# Patient Record
Sex: Male | Born: 1935 | Race: White | Hispanic: No | Smoking: Former smoker
Health system: Southern US, Community
[De-identification: ages and names within clinical notes are randomized; demographics above are authoritative.]

## PROBLEM LIST (undated history)

## (undated) DIAGNOSIS — J439 Emphysema, unspecified: Secondary | ICD-10-CM

## (undated) DIAGNOSIS — I739 Peripheral vascular disease, unspecified: Secondary | ICD-10-CM

## (undated) HISTORY — PX: VASCULAR SURGERY: SHX849

## (undated) HISTORY — PX: CAROTID ENDARTERECTOMY: SUR193

---

## 1998-05-08 ENCOUNTER — Encounter: Payer: Self-pay | Admitting: *Deleted

## 1998-05-08 ENCOUNTER — Inpatient Hospital Stay (HOSPITAL_COMMUNITY): Admission: RE | Admit: 1998-05-08 | Discharge: 1998-05-13 | Payer: Self-pay | Admitting: *Deleted

## 1998-05-09 ENCOUNTER — Encounter: Payer: Self-pay | Admitting: *Deleted

## 2000-07-03 ENCOUNTER — Encounter: Payer: Self-pay | Admitting: *Deleted

## 2000-07-05 ENCOUNTER — Ambulatory Visit (HOSPITAL_COMMUNITY): Admission: RE | Admit: 2000-07-05 | Discharge: 2000-07-05 | Payer: Self-pay | Admitting: *Deleted

## 2002-02-19 ENCOUNTER — Encounter: Payer: Self-pay | Admitting: *Deleted

## 2002-02-21 ENCOUNTER — Ambulatory Visit (HOSPITAL_COMMUNITY): Admission: RE | Admit: 2002-02-21 | Discharge: 2002-02-21 | Payer: Self-pay | Admitting: *Deleted

## 2002-03-08 ENCOUNTER — Encounter (HOSPITAL_COMMUNITY): Admission: RE | Admit: 2002-03-08 | Discharge: 2002-04-07 | Payer: Self-pay | Admitting: Cardiology

## 2002-05-10 ENCOUNTER — Encounter: Payer: Self-pay | Admitting: *Deleted

## 2002-05-14 ENCOUNTER — Inpatient Hospital Stay (HOSPITAL_COMMUNITY): Admission: RE | Admit: 2002-05-14 | Discharge: 2002-05-20 | Payer: Self-pay | Admitting: *Deleted

## 2002-05-14 ENCOUNTER — Encounter: Payer: Self-pay | Admitting: *Deleted

## 2002-05-14 ENCOUNTER — Encounter (INDEPENDENT_AMBULATORY_CARE_PROVIDER_SITE_OTHER): Payer: Self-pay | Admitting: *Deleted

## 2002-05-15 ENCOUNTER — Encounter: Payer: Self-pay | Admitting: *Deleted

## 2002-05-16 ENCOUNTER — Encounter: Payer: Self-pay | Admitting: *Deleted

## 2004-12-24 ENCOUNTER — Ambulatory Visit: Payer: Self-pay | Admitting: Internal Medicine

## 2007-10-23 ENCOUNTER — Inpatient Hospital Stay (HOSPITAL_COMMUNITY): Admission: EM | Admit: 2007-10-23 | Discharge: 2007-10-28 | Payer: Self-pay | Admitting: Emergency Medicine

## 2007-10-23 ENCOUNTER — Encounter: Admission: RE | Admit: 2007-10-23 | Discharge: 2007-10-23 | Payer: Self-pay | Admitting: Family Medicine

## 2007-10-24 ENCOUNTER — Encounter (INDEPENDENT_AMBULATORY_CARE_PROVIDER_SITE_OTHER): Payer: Self-pay | Admitting: General Surgery

## 2010-05-23 ENCOUNTER — Encounter: Payer: Self-pay | Admitting: Internal Medicine

## 2010-09-14 NOTE — Op Note (Signed)
NAMETEJ, MURDAUGH              ACCOUNT NO.:  [id number]   MEDICAL RECORD NO.:  [id number]          PATIENT TYPE:  INP   LOCATION:  5501                         FACILITY:  [facility]   PHYSICIAN:  Gabrielle Dare. Janee Morn, M.D.DATE OF BIRTH:  [birth year]   DATE OF PROCEDURE:  10/24/2007  DATE OF DISCHARGE:                               OPERATIVE REPORT   PREOPERATIVE DIAGNOSIS:  Acute cholecystitis.   POSTOPERATIVE DIAGNOSIS:  Acute cholecystitis.   PROCEDURE:  Laparoscopic cholecystectomy.   SURGEON:  Liz Malady, MD.   ASSISTANT:  Adolph Pollack, MD.  Letha Cape, PA-C.   ANESTHESIA:  General.   HISTORY OF PRESENT ILLNESS:  Mr. Acton is an 77 year old male who was  admitted yesterday with a 2-day history of right upper quadrant pain.  CT scan done as an outpatient demonstrated acute cholecystitis and  possible pancreatitis.  He was admitted to the Medical Service.  He has  multiple medical problems.  Further evaluation with laboratory studies  demonstrated amylase and lipase within normal limits.  He was placed on  IV antibiotics and volume resuscitated.  He is brought down today for  urgent cholecystectomy.   PROCEDURE IN DETAIL:  Informed consent was obtained.  The patient was  identified in the preop holding area.  He was receiving intravenous  antibiotics.  He was brought to the operating room, and general  anesthesia was administered by the anesthesia staff.  His abdomen was  prepped and draped in the sterile fashion.  The supraumbilical area was  infiltrated with 0.25% Marcaine with epinephrine.  A supraumbilical  incision was made along the midline.  This was medial to his previous  scar which was somewhat off the midline.  Subcutaneous tissues were  dissected down revealing the anterior fascia.  This was divided sharply  and the peritoneal cavity was entered under direct vision without  difficulty, some omental adhesions were generally swept away.  A 0  Vicryl  purse-string suture was placed around the fascial opening and a  Hasson trocar was inserted into the abdomen.  The abdomen was  insufflated with carbon dioxide in standard fashion under direct vision,  and 11-mm epigastric and two 5-mm lateral ports were placed.  A 0.25%  Marcaine with epinephrine was used in all port sites.  The gallbladder  was wrapped and omentum stuck up to the anterior abdominal wall.  This  was gently swept down.  The omentum was then gradually and gently swept  off revealing an extremely inflamed gallbladder and this was drained  with Nejot device and there was purulent bile.  Much was drained to  facilitate the retraction of the body cephalad.  Further omental  adhesion were gradually swept down revealing the infundibulum.  This was  retracted inferolaterally.  The dissection began laterally and gradually  progressed medially.  Initially, some tissue over the cystic duct were  circumferentially dissected and clearly visualized this likely contained  a anterior branch of cystic artery.  This was clipped twice proximally  once distally and divided.  Further dissection also showed a posterior  branch of cystic artery, this  was clipped twice proximally, once  distally and divided.  Dissection then continued identifying the cystic  duct which was somewhat short.  There was significant omentum  inflammation there precluding to see performance of intraoperative  angiogram.  Further dissection was done.  The gallbladder was very  friable and falling apart.  The infundibulum was probed to confirm  identification of the cystic duct at the tapering of infundibulum.  Cystic duct there at that point was then dissected further and clipped  twice proximally and divided distally.  I would not hold another clip.  There was no leakage of bile from it.  The gallbladder was then taken  off the liver bed using Bovie cautery to get excellent hemostasis.  It  was placed in EndoCatch bag  and removed from the abdomen via the  supraumbilical port site.  The abdomen was copiously irrigated.  The  liver bed was again cauterized in several areas to get good hemostasis.  Once this was achieved two 1-1/2 pieces of Surgicel were placed in the  liver bed and due to the severe inflammation a 19-French round Blake  drain inserted via the lateral of the two port sites.  This was placed  in the gallbladder fossa.  The remainder of the irrigation fluid was  evacuated and it was clear.  Liver bed remains dry and clips remained in  good position.  Other ports were removed, pneumoperitoneum was released,  the supraumbilical Hasson was removed and the fascia there was tied and  closed with tiny 0 Vicryl purse-string suture with care not to trap any  intra-abdominal contents.  Remaining three wounds are copiously  irrigated.  Skin was closed with running 4-0 Vicryl and forming a  subcuticular stitch and the wound were dressed with Dermabond.  The  patient tolerated the procedure well without apparent complications, was  taken to the recovery room in stable condition.      Gabrielle Dare Janee Morn, M.D.  Electronically Signed     BET/MEDQ  D:  10/24/2007  T:  10/25/2007  Job:  [id number]   cc:   Lonia Blood, M.D.  Adolph Pollack, M.D.  Letha Cape, PA

## 2010-09-14 NOTE — H&P (Signed)
[name], [name] NO.:  [id number]   MEDICAL RECORD NO.:  [id number]          PATIENT TYPE:  INP   LOCATION:  5501                         FACILITY:  [facility]   PHYSICIAN:  Lonia Blood, M.D.       DATE OF BIRTH:  [birth year]   DATE OF ADMISSION:  10/23/2007  DATE OF DISCHARGE:                              HISTORY & PHYSICAL   PRIMARY CARE PHYSICIAN:  Dr. Ace Gins, MD with [facility].   CHIEF COMPLAINT:  Abdominal pain.   HISTORY OF PRESENT ILLNESS:  Mario Hartman is an 77 year old gentleman with  extensive atherosclerotic disease, COPD, and diabetes, who presented to  his primary care physician with couple of days of severe abdominal pain,  nausea, vomiting, and diarrhea.  He had the CT scan done at the  outpatient radiology center which showed acute cholecystitis and the  patient was referred to the emergency room.  In the emergency room, the  patient was febrile, hypotensive, and hypoxic and we were called to  admit him.  Currently, the patient is stabilized and feeling a little  bit better.  He does not have any further abdominal pain.  He denies any  chest pain and he says he is not short of breath right now.  He already  has been evaluated by the general surgeon.  He is scheduled for  operating room tomorrow.   PAST MEDICAL HISTORY:  1. COPD.  2. Peripheral vascular disease, status post aorto-bi-iliac bypass in      2004.  3. Status post right carotid endarterectomy in 2000.  4. Diabetes mellitus type 2.  5. Hypertension.  6. Hyperlipidemia.   HOME MEDICATIONS:  Lipitor, Altace, Celebrex, Flexeril, glipizide, and  Mirapex.   ALLERGIES:  No known drug allergies.   SOCIAL HISTORY:  The patient is a former tobacco user, quit in 1991.  Denies drinking alcohol.  Denies using drugs.  He is a widower and lives  with his son-in-law.   FAMILY HISTORY:  Positive for coronary artery disease.   REVIEW OF SYSTEMS:  As per HPI.  Positive  also for some restless leg  syndrome.  All other systems reviewed and negative.   PHYSICAL EXAMINATION:  VITAL SIGNS:  Upon admission, temperature 101.4,  heart rate 112, respiratory rate 18, and saturation 88% on room air.  GENERAL APPEARANCE:  This is a chronically ill gentleman, in no acute  distress, lying on stretcher.  He seems alert and oriented to place,  person, and time.  HEAD:  Normocephalic and atraumatic.  EYES:  Pupils are equal, round, and reactive light and accommodation.  Extraocular movements are intact.  THROAT:  Clear.  NECK:  Supple.  No JVD.  CHEST:  Bilateral rhonchi.  No wheezes.  No crackles.  Definitely barrel-  shaped.  HEART:  Tachycardiac, regular without murmurs, rubs, or gallops.  Please  note, the heart sounds are quite distant due to emphysema.  ABDOMEN:  Distended, tense, tympanitic, bowel sounds decreased but  present.  There is some minimal diffuse tenderness.  Right upper  quadrant tenderness is present.  Mario Hartman sign  is positive.  LOWER EXTREMITIES:  Without edema.  Feet are examined bilaterally and  they are without any trophic changes.  Pulse is decreased but present in  the lower extremities.   LABORATORY DATA:  On admission, white blood cell count is 26,000,  hemoglobin 13, and platelet count 276.  Sodium 131, potassium 4.2,  chloride is 98, bicarbonate 24, BUN 42, creatinine 2.7, glucose 64,  albumin 3.2, AST 36, and total bilirubin is 1.4.  EKG shows normal sinus  rhythm, question of old septal MI.  No acute ST-T changes.  CT scan of  the abdomen indicates common bile duct stones, gallbladder stones,  changes of acute cholecystitis, and question of changes of acute  pancreatitis.   ASSESSMENT/PLAN:  1. Sepsis, most likely biliary source with acute cholecystitis,      probably component of acute cholangitis as well.  Mr. Benning will      be admitted, placed on intravenous fluids.  Empiric Unasyn has been      started by the surgeon.  The  patient will be stabilized and      monitored overnight. Tomorrow he is scheduled to go for      cholecystectomy.  2. Acute renal failure, most likely secondary to medications and      sepsis.  The patient's ACE inhibitor and COX-2 medication will be      discontinued.  He will be hydrated and his renal function will be      closely monitored.  3. Chronic obstructive pulmonary disease.  Mr. Amero is not oxygen-      dependent.  He does seem though clinically to have quite      significant degree of emphysema.  He will be placed on nebulizers,      incentive spirometry and his status will be closely monitor postop.  4. Extensive atherosclerotic disease.  Mr. Richrd Sox is at high risk of      perioperative complications.  He will be dosed with a beta-      blockers, aspirin and his statin will be continued.  5. Diabetes mellitus with hypoglycemia.  This is most likely secondary      to the sulfonylurea in the setting of acute renal insufficiency.      Intravenous D5 will be given through the night and the patient's      CBGs will be monitored closely.  Postop, he will need CBG checks      every 4 hours. Will start a sliding scale insulin once hypoglycemia      resolves.  6. Deep vein thrombosis prophylaxis will be done with PAS hoses until      the patient can take heparin.      Lonia Blood, M.D.  Electronically Signed     SL/MEDQ  D:  10/23/2007  T:  10/25/2007  Job:  [id number]   cc:   Ace Gins, MD

## 2010-09-14 NOTE — Discharge Summary (Signed)
NAMECHICK, COUSINS NO.:  [id number]   MEDICAL RECORD NO.:  [id number]          PATIENT TYPE:  INP   LOCATION:  5504                         FACILITY:  [facility]   PHYSICIAN:  Beckey Rutter, MD  DATE OF BIRTH:  1923-11-03   DATE OF ADMISSION:  10/23/2007  DATE OF DISCHARGE:  10/28/2007                               DISCHARGE SUMMARY   PRIMARY CARE PHYSICIAN:  Ace Gins, MD, [facility].   CHIEF COMPLAINT:  Abdominal pain.   HISTORY OF PRESENT ILLNESS:  An 28 pleasant Caucasian male admitted for  abdominal pain and found to have cholecystitis.   HOSPITAL COURSE:  1. Acute cholecystitis.  The patient is status post laparoscopic      cholecystectomy.  He improved with Primaxin.  During the hospital      stay with no fever and continuous decreasing of the white blood      count.  The patient is stable to be released as per surgical      service.  2. COPD.  The patient seems to have exacerbation post surgery.  He was      not given steroid because of the state of infection and      questionable sepsis that he presented with.  The patient required      antibiotic for the COPD exacerbation and currently the patient is      on 2 liters of oxygen.  His saturation is 93% on the room air and      we will conduct exercise test to evaluate if the patient needs to      be released with oxygen at this time.  The patient had COPD without      oxygen dependency prior to admission.  3. Ethanol withdrawal.  The patient become a little bit shaky during      hospital course.  He drinks 2 to 3 drinks of whiskey on a daily      basis.  The patient was kept on Librium protocol with thiamine and      folic acid.  The patient has no intention to quit those couple of      whiskey glasses after discharge and for that we will not continue      on protocol after discharge.  Although he recommended to still      decrease his alcohol consumption further.   DISCHARGE DIAGNOSES:  1. Acute cholecystitis.  Now he is status post laparoscopic      cholecystectomy done by Dr. Ermalene Searing.  2. Chronic obstructive pulmonary disease with exacerbation.  3. Diabetes.  4. Hypertension.  5. Alcohol withdrawal, required Librium protocol.   DISCHARGE MEDICATIONS:  1. Augmentin 875 mg p.o. b.i.d. for 7 more days.  2. Atrovent q.6 h.  3. Multivitamins daily.  4. Folic acid 1 mg daily.  5. Thiamine 100 mg p.o. daily.  6. Colace 100 mg p.o. nightly.  7. Tylenol p.r.n.  8. Glipizide.   DISCHARGE PLAN:  The patient is discharged today to follow up with Dr.  Ace Gins within a week.  The patient is  being fever-free and is  stable for discharge during this hospital course.  The patient had 78%  oxygen saturation after walk in the hall here to evaluate the need for  home oxygen.  With 78% clearly he needs home oxygen for COPD  exacerbation, although the patient need to be reevaluated for the need  of home oxygen and that is why he was recommended to follow up with Dr.  Ace Gins within this week to further assess his white blood count  and reevaluate his COPD exacerbation and the need for oxygen.  The  patient is aware and agreeable to discharge plan.      Beckey Rutter, MD  Electronically Signed     EME/MEDQ  D:  10/28/2007  T:  10/28/2007  Job:  [id number]

## 2010-09-14 NOTE — Consult Note (Signed)
NAMEWILL, HEINKEL NO.:  [id number]   MEDICAL RECORD NO.:  [id number]          PATIENT TYPE:  EMS   LOCATION:  MAJO                         FACILITY:  [facility]   PHYSICIAN:  Ardeth Sportsman, MD     DATE OF BIRTH:  [birth year]   DATE OF CONSULTATION:  10/23/2007  DATE OF DISCHARGE:                                 CONSULTATION   PRIMARY CARE PHYSICIAN:  Ace Gins, MD.   REQUESTING PHYSICIAN:  Devoria Albe, MD   REASON FOR CONSULTATION:  Abdominal pain, probable cholecystitis.   HISTORY OF PRESENT ILLNESS:  Mr. Zane is an 77 year old male with a  history of peripheral vascular disease, status post carotid  endarterectomy; with right renal artery stenosis; aortoiliac disease,  hypertension; possible COPD; and diabetes.  He notes that 2 days ago he  started to having upper abdominal pain that was rather intense.  It seem  to be greater on his right than his left.  He had some nausea but no  definite emesis.  He feels that the pain medicine did not seem to help  things out.  Given the concern of CT scan for a cholecystitis and  possible common bile duct stones, it was recommended that he go to the  emergency room.  He had been struggling with severe pain.  He notes 2  months ago he had a similar episode of pain after eating, but it went  away in less than 24 hours, and he did not require any further treatment  for that.  He denies any history of heartburn or reflux.  He has never  had a colonoscopy.  Normally he has bowel movement everyday with no bad  bouts of constipation or diarrhea.  No history of inflammatory bowel  disease, Crohn disease, or irritable bowel syndrome that he can recall.  No sick contacts or travel history.  No history of pancreatitis.  He  does drink liquor on a daily basis, but denies any major problems in the  past.   PAST MEDICAL HISTORY:  1. Aortoiliac occlusive disease.  2. Severe right renal artery stenosis.  3. Ischemic wrist pain  on the right foot, status post aortobifemoral      bypass.  4. Bilateral profundoplasty.  5. Right aortorenal bypass.  6. Diabetes mellitus non-insulin requiring.  7. Hypertension.  8. Probable chronic obstructive pulmonary disease with history of long-      term tobacco abuse.  9. Alcohol dependence.  10.Chronic degenerative joint disease.   PAST SURGICAL HISTORY:  1. He cannot recall any significant abdominal surgery aside from his      aortobifemoral bypass with bilateral profundoplasty and right      aortorenal bypass on January 2004.  2. Right carotid endarterectomy in 2003.   ALLERGIES:  Denies.   MEDICATIONS:  1. Altace.  2. Celebrex.  3. Flexeril.  4. Glipizide.  5. Lipitor.  6. Mirapex.   DIAGNOSIS:  Hypocholesterolemia.   SOCIAL HISTORY:  He tells me he drinks liquor couple of times at night.  He had 100-pack-year history of tobacco because he smoked 2 packs a  day  for 64 years and quit in 2000.  No drug use.  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  He  lives in [place] with his former son-in-law.   FAMILY HISTORY:  He cannot recall any significant history.  His mother  passed away from breast cancer at the age of 28s.  His father had a  myocardial infarction, but no siblings.   REVIEW OF SYSTEMS:  As noted per HPI.  GENERAL:  He has never had any  fever or chills, although he feels tremulous since he had a low blood  sugar in the ER.  No change in weight.  EYES:  He does wear glasses  otherwise negative.  ENT:  Negative.  CARDIAC:  As noted above.  No  exertional chest pain or shortness of breath.  He did have a cardiac  catheterization evaluation including a nuclear adenosine Cardiolite  study, which showed an EF around 56%, but this was back in 2003.  I do  not know if he has had anything done since that time.  GI:  As noted per  HPI.  No hematochezia or melena.  GU:  Negative.  MUSCULOSKELETAL:  He   has chronic back pain.  He also complaining of some chronic hip pain as  well, but it has not changed markedly recently.  Otherwise, negative.  PSYCH:  Allergic.  GU:  Testicles and breasts negative. Heme:  Negative.  Lymphs:  Negative.   PHYSICAL EXAMINATION:  VITAL SIGNS:  His temperature is 98.0.  His pulse  is 97-110 seems sinus and respiration is 20.  His pain initially was  0/10, but on my exam it is more like 7/10.  He was 0/10 just after  receiving some pain medications.  He is 94% on room air.  GENERAL:  He is a well developed, well nourished obese male shaking in  bed but consolable.  PSYCHIATRY:  He is awake and oriented x4.  He is obviously uncomfortable  but consolable.  He is frankly toxic at this time.  No end-stage  dementia, delirium, psychosis, or paranoia.  NEUROLOGIC:  Cranial nerves II through XII are intact.  Handgrips 5/5,  equal, and symmetrical.  He does have a resting sort of body shake but  no true intention tremors.  No focal or sensory motor weakness.  EYES:  He does wear glasses.  The pupils are equal, round, and reactive  to light.  Extraocular movements are intact.  His sclerae is nonicteric  or injected.  NECK:  Supple without any masses.  He has a right oblique neck scar  consistent with his prior endarterectomy but no obvious masses.  HEENT:  He is normocephalic.  No facial asymmetry.  Mucous membranes are  dry but nasopharynx and oropharynx is clear.  HEART:  Regular rate and rhythm.  No definite murmurs, gallops, or rubs.  CHEST:  Clear to auscultation bilaterally.  No wheezes.  No rhonchi.  No  pain on ribs on compression.  ABDOMEN:  Obese but soft.  It seems slightly distended.  Left side of  his abdomen is completely nontender.  Right lower abdomen is mildly  tender, but his right upper quadrant is very tender with a Murphy sign.  He has midline incision with no obvious incisional hernias.  GENITOURINARY:  Normal external genitalia.  No obvious  inguinal hernias.  I did not do rectal per his request.  EXTREMITIES:  No  significant edema or cyanosis.  LYMPH NODE:  No head, neck, axillary, or subcarinal lymphadenopathy.  SKIN:  No major petechia and purpura.  No major sores or lesions.   LABORATORY DATA:  His white count is 26.5 with a hemoglobin of 13.2 with  a left shift.  His potassium 4.2; his BUN is 42; his creatinine is 2.7;  albumin of 3.2; his total bilirubin is 1.6; his alk phos is 36, which is  actually in a low; and AST and ALT are in the normal range.  His lipase  is 22, which is normal.  He had a CT scan, which shows obvious  gallbladder wall thickening.  Pericholecystic fluids suspicious for  cholecystitis.  He may have some common bile duct stones but his bile  duct is only at 9 mm.  No definite evidence of any pancreatitis.  His  abdomen is obese but no strong evidence of bowel obstruction or massive  distention.  He is status post aortobifemoral bypass.  He has  calcifications within the kidneys itself.  He might have some  pancreatitis, but it difficult to say.  Mild fatty infiltration of the  liver.  Appendix is normal.   ASSESSMENT/PLAN:  An 77 year old male with significant tobacco abuse and  pulmonary issues with significant coronary disease with hypoglycemia and  diabetes with a prior renal stenosis without evidence of elevated  creatinine.  Prior creatinine in 2004 was 0.9 when he had no renal  artery stenosis at that time.  1. I think he needs medicine evaluation and admitted and following      given his numerous complex medical issues to help stabilize these      issues.  2. Aggressive intravenous fluid resuscitation initially.  3. Intravenous Unasyn q.6 h.  4. He will need treatment for his cholecystitis.  If he has clinical      deterioration or shock or does not improve in his other systems      then I will recommend percutaneous drainage of his gallbladder in      the morning.  If he does  stabilized and improve in his renal      function and other issues then I would set up for a cholecystectomy      in the morning.  The anatomy and physiology of hepatobiliary and      pancreatic function was discussed, past physiology of cholecystitis      with its risks were discussed.  Options discussed.  Recommendation      was made for laparoscopic cholecystectomy with intraoperative      cholangiogram.  Risks, benefits, and alternatives discussed.      Questions answered.  He and his family agreed to see.  5. Hold his aspirin for now.  6. SVDs for deep venous thrombosis prophylaxis.  7. Watch for alcohol withdrawal possible.  8. Diabetic control per medicine.  9. We will get an EKG 12 lead and a chest x-ray to just get some      baseline cardiac function.  I do not know if needs an      echocardiogram at this point if he is otherwise has descent      exercise tolerance, I will defer to medicine, and we will get an      insight from them.  I discussed with the patient, his daughter and      son-in-law, and they agreed to this plan.      Ardeth Sportsman, MD  Electronically Signed  SCG/MEDQ  D:  10/23/2007  T:  10/24/2007  Job:  [id number]   cc:   Ace Gins, MD  Devoria Albe, M.D.

## 2010-09-17 NOTE — Op Note (Signed)
NAME:  Mario Hartman, Mario Hartman                        ACCOUNT NO.:  [id number]   MEDICAL RECORD NO.:  [id number]                   PATIENT TYPE:  INP   LOCATION:  2316                                 FACILITY:  [facility]   PHYSICIAN:  Balinda Quails, M.D.                 DATE OF BIRTH:  08/28/1923   DATE OF PROCEDURE:  05/14/2002  DATE OF DISCHARGE:                                 OPERATIVE REPORT   SURGEON:  Balinda Quails, M.D.   ASSISTANT:  Eber Hong, P.A.   ANESTHETIC:  General endotracheal.   ANESTHESIOLOGIST:  Dr. Katrinka Blazing.   PREOPERATIVE DIAGNOSES:  1. Aortoiliac occlusive disease.  2. Ischemic rest pain, right foot.  3. Severe right renal artery stenosis.   POSTOPERATIVE DIAGNOSES:  1. Aortoiliac occlusive disease.  2. Ischemic rest pain, right foot.  3. Severe right renal artery stenosis.   PROCEDURES:  1. Aortobifemoral bypass.  2. Bilateral profundoplasty.  3. Right aortorenal bypass with 6-mm Dacron Hemashield graft.   CLINICAL NOTE:  The patient is a 77 year old male with a history of known  aortoiliac occlusive disease and severe claudication.  He has recently begun  to develop rest pain in his right foot.  Arteriography reveals advanced  aortoiliac disease.  Right external iliac occlusion.  Several stenoses in  the left iliac system.  Severe stenosis of the right renal artery origin.  Bilateral superficial femoral artery occlusions.   The patient is brought to the operating room at this time for lower  extremity revascularization with aortobifemoral bypass for limb salvage.  Also scheduled for right aortorenal bypass.   The risks of this operative procedure, including major complications, such  as MI, CVA, renal failure, limb loss, bleeding, infection, and death have  been discussed with the patient.  Patient morbidity and mortality risk of  approximately 3% to 5%.   OPERATIVE PROCEDURE:  The patient brought to the operating room in stable  condition.   Foley catheter, arterial line, Swan-Ganz catheter in place.  General endotracheal anesthesia induced.  In the supine position, the  abdomen and both legs prepped and draped in a sterile fashion.   A longitudinal skin incision made from the xiphoid to the umbilicus.  Dissection carried down through the subcutaneous tissue with electrocautery.  Linea alba incised at midline.  The peritoneal cavity entered without  difficulty.  Full laparotomy evaluation carried out.  Liver, gallbladder,  bile duct, pancreas were normal.  Stomach and duodenum were unremarkable.  Large bowel revealed no masses.  Small bowel was normal.   The small bowel retracted to the right, transverse colon brought out  superiorly.  The retroperitoneum incised along the infrarenal aorta.  The  left renal vein identified, mobilized, and tracked superiorly.  The anterior  mesenteric vein also retracted.  The juxta renal aorta was cleared.  The  right renal artery origin was identified, and the right renal artery was  dissected distally, deep to the inferior vena cava.  The proximal portion of  the right renal artery revealed ostial plaque, and the distal right renal  artery was free of significant plaque.   The infrarenal aorta was cleared down to the aortic bifurcation.  This was  calcified with plaque.  The anterior mesenteric artery origin was encircled  with the vessel loop.  Common iliac arteries bilaterally were severely  diseased with plaque.   Bilateral longitudinal groin skin incisions were made.  Subcutaneous tissue  and lymphatics divided with electrocautery.  The common femoral artery  dissected out.  The inguinal ligament encircled with a vessel loop.  The  common femoral artery dissected distally down to the origin of the  superficial femoral artery, which was also encircled with a vessel loop.  Bilateral superificial and femoral artery occlusions were present.  The  profunda femoris artery was dissected out  bilaterally.  The profunda veins  ligated with 2-0 and 3-0 silk, and divided.  The proximal profunda femoris  freed and encircled with a vessel loop.   Retroperitoneal tunnels were then created from the aortic bifurcation at  each groin.  The patient then administered 7000 units of heparin  intravenously, 25 g of ___________ intravenously.  Adequate circulation time  permitted.   The infrarenal aorta controlled with an aortic DeBakey clamp.  The aorta  also controlled distally at the bifurcation of the second aortic DeBakey  clamp.  The infrarenal aorta divided with an #11 blade.  There was severe  atherosclerotic plaque and degenerative plaque present.  A short segment of  aorta was excised, and the aorta bevelled distally.  The distal stump of  aorta was oversewn with running 3-0 Vicryl suture in 2 layers.  The distal  clamp then removed.  The inferior mesenteric artery was preserved in the  distal stump.   The proximal aortic cuff was then endarterectomized.  A 14 x 7 Hemashield  graft was then anastomosed end-to-end to the infrarenal aorta with a running  4-0 Prolene suture.  At the completion of the proximal anastomosis, the  aortic graft was flushed, and each limb of the graft controlled with a  Fogarty clamp.  The graft limbs were then tunneled to the respective groins,  through the retroperitoneal tunnels, behind the ureters.   In the right groin, the profunda femoris artery was controlled proximally  and distally, and a longitudinal arteriotomy made in the proximal profunda  femoris artery.  The right limb of the graft bevelled, and anastomosed end-  to-side to the profunda femoris using running 6-0 Prolene suture.  At  completion of this, adequate flushing carried out.  Clamps removed and the  right leg reperfused.   In the left groin, the common femoral artery, superficial, and profunda were all controlled with clamps.  A longitudinal arteriotomy was made in the  proximal  profunda femoris artery.  This was then extended proximally into  the common femoral artery.  The left limb of the graft was anastomosed end-  to-side from the left common femoral artery, bringing the toe of the graft  out onto the profunda femoris artery, using running 6-0 Prolene suture.  At  the completion of the left femoral anastomosis, the vessels were flushed,  clamps removed, and the left leg reperfused.   Attention then placed on the right renal artery.  A partial occlusion clamp  was placed across the aortic graft.  The aortic graft opened with an #11  blade and #5  punch.  A 6-mm Dacron graft was anastomosed end-to-side to the  aortic graft with running 5-0 Prolene suture.  At the completion of this,  the partial occlusion clamp removed, and the renal graft controlled with a  Fogarty clamp.  The right renal artery was then ligated proximally with an  #0 silk tie, and divided distally beyond the plaque.  The 6-mm Dacron renal  graft was then anastomosed end-to-end to the right renal artery with running  6-0 Prolene suture.  At the completion of the renal anastomosis, adequate  flushing carried out.  The clamps were removed.  The right kidney  reperfused.  Ischemia time of approximately 20 minutes.   The patient administered 50 mg of protamine intravenously.  Adequate  hemostasis obtained.  Sponge and instrument counts were correct.   The retroperitoneal was reapproximated over the graft with running 3-0  Vicryl suture.  The abdomen examined to assure that there were no retained  instruments or sponges.  The midline fascia then closed with a running #1  PDS suture, staples applied to skin.   The groin incision was irrigated with antibiotic solution.  Each groin  incision then closed with 2 layers of running 2-0 Vicryl suture in the  subcutaneous tissues.  Staples applied to the skin.  Sterile dressings  applied.   The patient tolerated the operative procedure well.  No  apparent  complications.  Transferred to the surgical intensive care unit in stable  condition.                                               Balinda Quails, M.D.    PGH/MEDQ  D:  05/14/2002  T:  05/14/2002  Job:  [id number]

## 2010-09-17 NOTE — Procedures (Signed)
[facility]  Patient:    Mario Hartman, Mario Hartman                     MRN: [id number] Proc. Date: 07/05/00 Adm. Date:  [id number] Attending:  Melvenia Needles CC:         Adam Phenix, M.D., [place]  Peripheral Catheterization Lab   Procedure Report  DIAGNOSIS:  Bilateral lower extremity claudication.  PROCEDURES: 1. Midabdominal aortogram with bilateral lower extremity runoff arteriography. 2. Left femoral intraarterial nitroglycerin injection. 3. Pullback pressure gradient, left common iliac artery.  ACCESS:  Left common femoral 5 French sheath.  CONTRAST:  Visipaque 165 mL. (severe generalized debilitation).  COMPLICATIONS:  None apparent.  CLINICAL NOTE:  This is a 77 year old male who presented to the office with marked disabling bilateral lower extremity claudication.  Ankle-brachial index 0.3 in the right leg, 0.5 in the left leg.  Evaluation revealed evidence of aortoiliac disease and multilevel infrainguinal disease.  The patient is brought to the catheterization lab at this time for diagnostic arteriography and possible intervention.  DESCRIPTION OF PROCEDURE:  The patient was brought to the catheterization lab in stable condition. Informed consent was obtained.  He received 5 mg of Valium preprocedure.  He was placed in the supine position.  Both groins were prepped and draped in the usual sterile fashion.  The skin and subcutaneous tissue in the left groin was instilled with 1% Xylocaine.  A needle was easily introduced to the left common femoral artery, and a 0.35 J wire passed through the needle into the midabdominal aorta. An initial 5 French sheath was advanced over the guidewire.  The sheath was flushed with heparin saline solution.  A pigtail catheter was then advanced over the guidewire into the Juxta renal aorta.  Standard AP midabdominal aortogram obtained.  This revealed single bilateral renal arteries which were  widely patent.  The infrarenal aorta revealed mild atherosclerotic irregularity without dominant stenosis.  The left common iliac artery revealed moderate atherosclerotic irregularity. There was approximately a 40% stenosis in the midportion of the left common iliac artery.  The left external iliac artery revealed diffuse disease without dominant stenosis.  The left internal iliac artery was small and there was stenosis at its origin.  The right iliac system revealed patency of the common iliac artery.  The right external iliac artery was occluded at its origin.  The large right internal iliac artery was present and provided extensive pelvic collaterals which reconstituted the right common femoral artery.  The pigtail catheter was brought down to the aortic bifurcation and lower extremity runoff arteriography obtained.  The left leg revealed continuous flow from the external iliac and the common femoral artery which revealed moderate irregularity.  There was a large left profunda femoris artery.  The left superficial femoral artery was occluded at its origin.  Profunda femoris collaterals then reconstituted disease above the popliteal segment with flow into the left anterior tibial artery which provided dominant flow to the left foot.  The left tibial peroneal trunk was occluded at its origin.  The left anterior tibial artery did reveal a stenotic segment in the mid calf.  The dorsalis pedis artery provided flow to the left foot.  Right lower extremity runoff arteriography revealed reconstitution of the right common femoral artery from pelvic collaterals.  The right superficial artery occluded at its origin.  The right profunda femoris artery was large and provided extensive pelvic collaterals.  Reconstitution was severely diseased above the  popliteal segment was evident in the right leg.  The right below-knee popliteal artery was more normal in caliber.  Runoff in the right lower  extremity was via patent anterior tibial and tibial peroneal arteries. The right posterior tibial artery was small and diseased in the midcalf. Dominant runoff distally was anterior tibial and peroneal in the right lower extremity. The pigtail catheter was then exchange over the J wire for an end-hole catheter.  Intraarterial injection of 200 mcg of nitroglycerin was then made in the left femoral sheath.  Pullback pressure gradient was made across the left iliac system from the aortic bifurcation to the external iliac artery. This revealed a 10 mm gradient.  The J wire was reinserted and the end-hole catheter removed.  The left femoral sheath removed and a long 7 French sheath advanced over the guidewire.  The patient was administered 3000 units of heparin intravenously.  Retrograde injection was then made through the left femoral sheath and this further delineated the left common iliac and external iliac anatomy.  This revealed a moderate stenosis at the mid left common iliac artery and extensive irregular disease at the external iliac artery without dominant stenosis.  It was decided at this time, not to perform an intervention.  The guidewire was removed.  The patient was transferred to the holding area.  The left femoral sheath was removed without incident.  The patient tolerated the procedure well without apparent complications.  FINAL IMPRESSION: 1. Aortoiliac occlusive disease with right external iliac occlusion.  Moderate    left common iliac and external iliac occlusive disease. 2. Bilateral superficial femoral artery occlusion. 3. Moderate bilateral tibial vessel runoff occlusive disease.  DISPOSITION:  These results will be reviewed with the patient.  The patient may be a candidate for aortobifemoral bypass or femoral-femoral bypass.  The patient will be referred for preoperative cardiology clearance. DD:  07/05/00 TD:  07/05/00 Job: [id number]

## 2010-09-17 NOTE — Discharge Summary (Signed)
NAME:  Mario Hartman, Mario Hartman                        ACCOUNT NO.:  [id number]   MEDICAL RECORD NO.:  [id number]                   PATIENT TYPE:  INP   LOCATION:  2015                                 FACILITY:  [facility]   PHYSICIAN:  Balinda Quails, M.D.                 DATE OF BIRTH:  12/22/1923   DATE OF ADMISSION:  05/14/2002  DATE OF DISCHARGE:  05/20/2002                                 DISCHARGE SUMMARY   PRIMARY ADMITTING DIAGNOSES:  1. Aortoiliac occlusive disease.  2. Right renal artery stenosis.   ADDITIONAL/DISCHARGE DIAGNOSES:  1. Aortoiliac occlusive disease.  2. Severe right renal artery stenosis.  3. Ischemic rest pain, right foot.  4. History of right carotid endarterectomy in 1/00 by Dr. Madilyn Fireman.  5. Newly diagnosed noninsulin-dependent diabetes mellitus.  6. History of chronic back pain and arthritis.  7. Hypertension.  8. Probable chronic obstructive pulmonary disease.   PROCEDURE PERFORMED:  1. Aortobifemoral bypass with 14 x 7 mm Hemashield graft.  2. Bilateral profundoplasty.  3. Right aortorenal bypass with 6-mm Dacron Hemashield graft.   HISTORY:  The patient is a 77 year old male with a history of peripheral  vascular disease. He has been followed by Dr. Madilyn Fireman for some time; however,  his symptoms have become progressively worse. He now has significant rest  pain, worse on the right than the left. He was seen in the office in 9/03,  and his Doppler studies showed ABIs of 0.39 on the right and 0.50 on the  left. Upon further evaluation of the patient and review of his previously  performed arteriogram, he recommended that he proceed with surgical  revascularization at this time. Preoperatively, he was seen by Dr. Juanito Doom  and was cleared from a cardiac standpoint to proceed with surgery.   HOSPITAL COURSE:  He was admitted on 1/13 and taken to the operating room  where he underwent the above noted procedures. He tolerated this well and  was transferred to the  SICU in stable condition. He was extubated shortly  after surgery and was hemodynamically stable on postoperative day #1. He was  slowly mobilized, and by postoperative day #2, he was ready for transfer to  the floor. His postoperative ABIs showed some improvement, at 0.51 on the  right and 0.55 on the left. Over the course of the next several days, his GI  function returned. He was initially started on clear liquid diet and has  been slowly advanced now to soft regular diet which he is tolerating without  difficulty. He has had normal bowel movements since resuming his diet.  Otherwise, he has done well. He has been ambulating in the halls without  difficulty. His pain was well controlled with p.o. pain medications. His  surgical incision sites were healing well. He has remained afebrile, and his  vital signs have remained stable although his blood pressure has been mildly  elevated as  well as his heart rate. His systolic blood pressure has been  running anywhere from 150 to 170 with heart rate around 100. He has been  started on Lopressor, and this has remained stable. He is tolerating the  Lopressor well. It is felt that if he continues to remain stable he will be  ready for discharge home on 05/20/02.    DISCHARGE MEDICATIONS:  1. Toprol-XL 50 mg daily.  2. Pepcid 20 mg b.i.d.  3. Tylox one to two q.4h. p.r.n. for pain.  4. He is to continue his home medications which are:  Glucotrol XL 5 mg     daily, enteric-coated aspirin 325 mg daily, and Flexeril 10 mg one to     three times daily as needed.   DISCHARGE INSTRUCTIONS:  He is to refrain from driving, heavy lifting, or  strenuous activity. He may continue his preoperative diet. He is asked to  shower daily and clean his incisions with soap and water.   DISCHARGE FOLLOWUP:  The [facility] office will contact him with followup  appointments to see the nurse in one week for staple removal and Dr. Madilyn Fireman  in three weeks with repeat ABIs.  He is also asked to make an appointment to  see his medical doctor in the next one to two weeks for blood pressure  recheck.     Coral Ceo, P.A.                        Balinda Quails, M.D.    GC/MEDQ  D:  05/19/2002  T:  05/20/2002  Job:  [id number]   cc:   Thomas C. Wall, M.D. [facility]  [address]  [place]  [place] [address]  Fax: 1   Heywood Footman, S.N.P.

## 2010-09-17 NOTE — Cardiovascular Report (Signed)
NAME:  Mario Hartman, Mario Hartman                        ACCOUNT NO.:  [id number]   MEDICAL RECORD NO.:  [id number]                   PATIENT TYPE:  OIB   LOCATION:  2864                                 FACILITY:  [facility]   PHYSICIAN:  Balinda Quails, M.D.                 DATE OF BIRTH:  1924-05-01   DATE OF PROCEDURE:  02/21/2002  DATE OF DISCHARGE:                              CARDIAC CATHETERIZATION   DIAGNOSIS:  1. Aortoiliac occlusive disease.  2. Ischemic rest pain, right foot.   PROCEDURE:  Abdominal aortogram with bilateral lower extremity runoff  arteriography.   ACCESS:  Left common femoral artery 5 French sheath.   CONTRAST:  Visipaque 140 mL.   COMPLICATIONS:  None apparent.   CLINICAL NOTE:  The patient is a 77 year old male with known peripheral  vascular disease who has been followed in the office.  He recently presented  with worsening symptoms and early rest pain in his right foot. He has  previously undergone arteriography revealing aortoiliac disease and  bilateral superficial femoral disease. He is brought to the catheterization  lab at this time for repeat diagnostic arteriography for planning  intervention.   DESCRIPTION OF PROCEDURE:  The patient was brought to the catheterization  lab in stable condition. He was placed in the supine position.  Both groins  were prepped and draped in a sterile fashion.  The skin and subcutaneous  tissues in the left groin were instilled with 1% Xylocaine.  The patient was  administered 2 mg of Nubain, 2 mg of Versed intravenously.   A needle was easily introduced in the left common femoral artery. A 0.35  Wholey guide wire advanced through the needle into the midabdominal aorta.  The needle was removed and  a 5 [place] sheath advanced over the guide wire.  The dilator was removed.  The sheath was flushed with heparin and saline  solution.   The standard pigtail catheter was advanced over the guide wire to the  midabdominal  aorta.  Standard AP and midabdominal aortogram were obtained.  This revealed single bilateral renal arteries.  Severe stenosis of the right  main middle artery estimated to be 80-90%.  The left main renal artery was  widely patent.  The infrarenal aorta revealed moderate atherosclerotic  irregularity.  The right common iliac artery was patent.  The right external  iliac artery was occluded at its origin. The right internal iliac artery was  patent.  The left iliac system revealed extensive plaque throughout its  length.  There were multiple moderate stenoses but no dominant stenosis in  the left internal, external or common iliac vessels.   Lower extremity runoff arteriography revealed the right common femoral  artery to reconstitute from collaterals.  The right profunda femoris artery  was widely patent.  The right superficial femoral artery was occluded at its  origin.  Collaterals reconstituted the popliteal artery above the patella.  The right popliteal artery was patent with three-vessel tibial runoff to the  right lower extremity.   The left lower extremity revealed continuous flow from the external iliac to  the common femoral level.  The left profunda femoris artery was large with  extensive collateralization.  The left superficial femoral artery was  occluded at its origin. The left popliteal artery reconstituted at the  abductor canal.  Popliteal artery then provided dominant runoff via the  anterior tibial and peroneal arteries in the left lower extremity.   Bilateral oblique pelvic arteriograms were obtained. These again verified  occlusion of the right external iliac artery at its origin.  The left iliac  system revealed multiple moderate stenoses throughout the common and  external iliac arteries.   This completed the arteriogram procedure. The guide wire was reinserted and  the catheter and guide wire removed.  The patient was transferred to the  holding area where the  left femoral sheath was removed.  No apparent  complications.  A total of 140 mL of Visipaque used.   FINAL IMPRESSION:  1. Severe right renal artery stenosis.  2. Moderate infrarenal aortic atherosclerosis.  3. Right external iliac artery occlusion.  4. Moderate left common and external iliac occlusive disease.  5. Bilateral superficial artery occlusions.   DISPOSITION:  These results have been reviewed with the patient and family.  It is recommended at this time that the patient undergo further cardiac work-  up for planned aortobifemoral bypass and probably right renal artery bypass.                                                   Balinda Quails, M.D.    PGH/MEDQ  D:  02/21/2002  T:  02/21/2002  Job:  [id number]   cc:   Peripheral Vascular Catheterization Lab

## 2010-09-17 NOTE — Op Note (Signed)
   NAME:  Mario Hartman, Mario Hartman                        ACCOUNT NO.:  [id number]   MEDICAL RECORD NO.:  [id number]                   PATIENT TYPE:  OIB   LOCATION:  2864                                 FACILITY:  [facility]   PHYSICIAN:  Jesse Sans. Wall, M.D. [facility]            DATE OF BIRTH:  November 04, 1923   DATE OF PROCEDURE:  DATE OF DISCHARGE:  02/21/2002                                 OPERATIVE REPORT   ADENOSINE CARDIOLITE:   BRIEF HISTORY:  The patient is a pleasant 77 year old male with a history of  peripheral vascular disease.  He has had previous vascular surgeries.  He is  scheduled to have further surgery by Dr. Jake Samples in [place].  He is  having this Adenosine Cardiolite as part of his presurgical clearance.   The patient reports no recent chest pain or shortness of breath.  His  baseline electrocardiogram showed normal sinus rhythm, rate 69 beats per  minute without ischemic changes.  Blood pressure 160/78.   Adenosine was administered minutes 1 through 4.  Cardiolite was given at 3  minutes.  The patient developed some mild flushing, but had no specific  symptoms.  He had a rare PVC on his electrocardiogram, but no other changes.  The final images are pending at the time of this dictation.     Delton See, P.A. [facility]                  Thomas C. Daleen Squibb, M.D. [facility]    DR/MEDQ  D:  03/08/2002  T:  03/10/2002  Job:  [id number]

## 2010-12-15 ENCOUNTER — Encounter (INDEPENDENT_AMBULATORY_CARE_PROVIDER_SITE_OTHER): Payer: Medicare Other | Admitting: Ophthalmology

## 2010-12-15 DIAGNOSIS — H3530 Unspecified macular degeneration: Secondary | ICD-10-CM

## 2010-12-15 DIAGNOSIS — H353290 Exudative age-related macular degeneration, unspecified eye, stage unspecified: Secondary | ICD-10-CM

## 2010-12-15 DIAGNOSIS — H43819 Vitreous degeneration, unspecified eye: Secondary | ICD-10-CM

## 2011-01-26 ENCOUNTER — Encounter (INDEPENDENT_AMBULATORY_CARE_PROVIDER_SITE_OTHER): Payer: Medicare Other | Admitting: Ophthalmology

## 2011-01-26 DIAGNOSIS — H3530 Unspecified macular degeneration: Secondary | ICD-10-CM

## 2011-01-26 DIAGNOSIS — H353290 Exudative age-related macular degeneration, unspecified eye, stage unspecified: Secondary | ICD-10-CM

## 2011-01-26 DIAGNOSIS — H43819 Vitreous degeneration, unspecified eye: Secondary | ICD-10-CM

## 2011-01-27 LAB — CBC
HCT: 32.3 — ABNORMAL LOW
HCT: 35.8 — ABNORMAL LOW
Hemoglobin: 10.2 — ABNORMAL LOW
Hemoglobin: 10.7 — ABNORMAL LOW
Hemoglobin: 10.9 — ABNORMAL LOW
Hemoglobin: 11.5 — ABNORMAL LOW
Hemoglobin: 12.5 — ABNORMAL LOW
MCHC: 34.3
MCHC: 34.9
MCV: 95.6
MCV: 95.6
Platelets: 199
Platelets: 207
Platelets: 208
RBC: 3.12 — ABNORMAL LOW
RBC: 4.09 — ABNORMAL LOW
RDW: 13.4
RDW: 13.7
RDW: 14.1
WBC: 12 — ABNORMAL HIGH
WBC: 13.8 — ABNORMAL HIGH
WBC: 14.3 — ABNORMAL HIGH
WBC: 26.5 — ABNORMAL HIGH

## 2011-01-27 LAB — DIFFERENTIAL
Basophils Absolute: 0
Eosinophils Absolute: 0
Lymphs Abs: 1.3
Monocytes Absolute: 1.9 — ABNORMAL HIGH
Neutro Abs: 23.3 — ABNORMAL HIGH

## 2011-01-27 LAB — HEMOGLOBIN A1C
Hgb A1c MFr Bld: 5.6
Mean Plasma Glucose: 122

## 2011-01-27 LAB — COMPREHENSIVE METABOLIC PANEL
ALT: 103 — ABNORMAL HIGH
ALT: 21
AST: 136 — ABNORMAL HIGH
AST: 36
Albumin: 2.6 — ABNORMAL LOW
Albumin: 3.2 — ABNORMAL LOW
Alkaline Phosphatase: 36 — ABNORMAL LOW
Alkaline Phosphatase: 45
Alkaline Phosphatase: 51
BUN: 38 — ABNORMAL HIGH
CO2: 22
CO2: 24
Calcium: 7.3 — ABNORMAL LOW
Chloride: 104
Chloride: 108
Chloride: 98
Creatinine, Ser: 2.07 — ABNORMAL HIGH
Creatinine, Ser: 2.7 — ABNORMAL HIGH
GFR calc Af Amer: 28 — ABNORMAL LOW
GFR calc Af Amer: 42 — ABNORMAL LOW
GFR calc non Af Amer: 23 — ABNORMAL LOW
GFR calc non Af Amer: 35 — ABNORMAL LOW
Glucose, Bld: 63 — ABNORMAL LOW
Potassium: 4.1
Potassium: 4.2
Potassium: 4.4
Sodium: 137
Total Bilirubin: 1.3 — ABNORMAL HIGH
Total Bilirubin: 1.6 — ABNORMAL HIGH
Total Protein: 5.6 — ABNORMAL LOW

## 2011-01-27 LAB — BASIC METABOLIC PANEL
BUN: 39 — ABNORMAL HIGH
CO2: 25
Calcium: 8 — ABNORMAL LOW
Calcium: 8 — ABNORMAL LOW
Chloride: 101
Creatinine, Ser: 1.49
GFR calc non Af Amer: 45 — ABNORMAL LOW
GFR calc non Af Amer: 49 — ABNORMAL LOW
GFR calc non Af Amer: 51 — ABNORMAL LOW
Glucose, Bld: 105 — ABNORMAL HIGH
Glucose, Bld: 116 — ABNORMAL HIGH
Glucose, Bld: 130 — ABNORMAL HIGH
Potassium: 4.6
Sodium: 138
Sodium: 138

## 2011-01-27 LAB — CARDIAC PANEL(CRET KIN+CKTOT+MB+TROPI)
CK, MB: 1.8
CK, MB: 2.7
Relative Index: 1.3
Relative Index: 1.4
Relative Index: 1.5
Total CK: 121
Troponin I: 0.06

## 2011-01-27 LAB — CULTURE, BLOOD (ROUTINE X 2)

## 2011-01-27 LAB — LIPID PANEL
Cholesterol: 74
LDL Cholesterol: 22

## 2011-01-27 LAB — PROTIME-INR: Prothrombin Time: 17.4 — ABNORMAL HIGH

## 2011-01-27 LAB — B-NATRIURETIC PEPTIDE (CONVERTED LAB): Pro B Natriuretic peptide (BNP): 166 — ABNORMAL HIGH

## 2011-03-09 ENCOUNTER — Encounter (INDEPENDENT_AMBULATORY_CARE_PROVIDER_SITE_OTHER): Payer: Medicare Other | Admitting: Ophthalmology

## 2011-03-09 DIAGNOSIS — H3530 Unspecified macular degeneration: Secondary | ICD-10-CM

## 2011-03-09 DIAGNOSIS — H353290 Exudative age-related macular degeneration, unspecified eye, stage unspecified: Secondary | ICD-10-CM

## 2011-03-09 DIAGNOSIS — H43819 Vitreous degeneration, unspecified eye: Secondary | ICD-10-CM

## 2011-04-20 ENCOUNTER — Encounter (INDEPENDENT_AMBULATORY_CARE_PROVIDER_SITE_OTHER): Payer: Medicare Other | Admitting: Ophthalmology

## 2011-04-20 DIAGNOSIS — I10 Essential (primary) hypertension: Secondary | ICD-10-CM

## 2011-04-20 DIAGNOSIS — H43819 Vitreous degeneration, unspecified eye: Secondary | ICD-10-CM

## 2011-04-20 DIAGNOSIS — H3530 Unspecified macular degeneration: Secondary | ICD-10-CM

## 2011-04-20 DIAGNOSIS — H35039 Hypertensive retinopathy, unspecified eye: Secondary | ICD-10-CM

## 2011-04-20 DIAGNOSIS — H353290 Exudative age-related macular degeneration, unspecified eye, stage unspecified: Secondary | ICD-10-CM

## 2011-06-01 ENCOUNTER — Encounter (INDEPENDENT_AMBULATORY_CARE_PROVIDER_SITE_OTHER): Payer: Medicare Other | Admitting: Ophthalmology

## 2011-06-01 DIAGNOSIS — I10 Essential (primary) hypertension: Secondary | ICD-10-CM

## 2011-06-01 DIAGNOSIS — H35039 Hypertensive retinopathy, unspecified eye: Secondary | ICD-10-CM

## 2011-06-01 DIAGNOSIS — H3530 Unspecified macular degeneration: Secondary | ICD-10-CM

## 2011-06-01 DIAGNOSIS — H353290 Exudative age-related macular degeneration, unspecified eye, stage unspecified: Secondary | ICD-10-CM

## 2011-06-01 DIAGNOSIS — H43819 Vitreous degeneration, unspecified eye: Secondary | ICD-10-CM

## 2011-07-13 ENCOUNTER — Encounter (INDEPENDENT_AMBULATORY_CARE_PROVIDER_SITE_OTHER): Payer: Medicare Other | Admitting: Ophthalmology

## 2011-07-13 DIAGNOSIS — H43819 Vitreous degeneration, unspecified eye: Secondary | ICD-10-CM

## 2011-07-13 DIAGNOSIS — H353290 Exudative age-related macular degeneration, unspecified eye, stage unspecified: Secondary | ICD-10-CM

## 2011-07-13 DIAGNOSIS — H3530 Unspecified macular degeneration: Secondary | ICD-10-CM

## 2011-08-24 ENCOUNTER — Encounter (INDEPENDENT_AMBULATORY_CARE_PROVIDER_SITE_OTHER): Payer: Medicare Other | Admitting: Ophthalmology

## 2011-08-24 DIAGNOSIS — H43819 Vitreous degeneration, unspecified eye: Secondary | ICD-10-CM

## 2011-08-24 DIAGNOSIS — H353290 Exudative age-related macular degeneration, unspecified eye, stage unspecified: Secondary | ICD-10-CM

## 2011-08-24 DIAGNOSIS — H3530 Unspecified macular degeneration: Secondary | ICD-10-CM

## 2011-09-30 ENCOUNTER — Encounter (INDEPENDENT_AMBULATORY_CARE_PROVIDER_SITE_OTHER): Payer: Medicare Other | Admitting: Ophthalmology

## 2011-09-30 DIAGNOSIS — H3530 Unspecified macular degeneration: Secondary | ICD-10-CM

## 2011-09-30 DIAGNOSIS — D3130 Benign neoplasm of unspecified choroid: Secondary | ICD-10-CM

## 2011-09-30 DIAGNOSIS — H353290 Exudative age-related macular degeneration, unspecified eye, stage unspecified: Secondary | ICD-10-CM

## 2011-09-30 DIAGNOSIS — H43819 Vitreous degeneration, unspecified eye: Secondary | ICD-10-CM

## 2011-11-17 ENCOUNTER — Encounter (INDEPENDENT_AMBULATORY_CARE_PROVIDER_SITE_OTHER): Payer: Medicare Other | Admitting: Ophthalmology

## 2011-11-17 DIAGNOSIS — H43819 Vitreous degeneration, unspecified eye: Secondary | ICD-10-CM

## 2011-11-17 DIAGNOSIS — I10 Essential (primary) hypertension: Secondary | ICD-10-CM

## 2011-11-17 DIAGNOSIS — H35039 Hypertensive retinopathy, unspecified eye: Secondary | ICD-10-CM

## 2011-11-17 DIAGNOSIS — H353290 Exudative age-related macular degeneration, unspecified eye, stage unspecified: Secondary | ICD-10-CM

## 2011-11-17 DIAGNOSIS — H3530 Unspecified macular degeneration: Secondary | ICD-10-CM

## 2011-12-19 ENCOUNTER — Encounter (INDEPENDENT_AMBULATORY_CARE_PROVIDER_SITE_OTHER): Payer: Medicare Other | Admitting: Ophthalmology

## 2011-12-19 DIAGNOSIS — H35039 Hypertensive retinopathy, unspecified eye: Secondary | ICD-10-CM

## 2011-12-19 DIAGNOSIS — H353290 Exudative age-related macular degeneration, unspecified eye, stage unspecified: Secondary | ICD-10-CM

## 2011-12-19 DIAGNOSIS — H43819 Vitreous degeneration, unspecified eye: Secondary | ICD-10-CM

## 2011-12-19 DIAGNOSIS — I10 Essential (primary) hypertension: Secondary | ICD-10-CM

## 2011-12-19 DIAGNOSIS — H3530 Unspecified macular degeneration: Secondary | ICD-10-CM

## 2012-01-30 ENCOUNTER — Encounter (INDEPENDENT_AMBULATORY_CARE_PROVIDER_SITE_OTHER): Payer: Medicare Other | Admitting: Ophthalmology

## 2012-01-30 DIAGNOSIS — H353290 Exudative age-related macular degeneration, unspecified eye, stage unspecified: Secondary | ICD-10-CM

## 2012-01-30 DIAGNOSIS — H35039 Hypertensive retinopathy, unspecified eye: Secondary | ICD-10-CM

## 2012-01-30 DIAGNOSIS — I10 Essential (primary) hypertension: Secondary | ICD-10-CM

## 2012-01-30 DIAGNOSIS — H3530 Unspecified macular degeneration: Secondary | ICD-10-CM

## 2012-01-30 DIAGNOSIS — H43819 Vitreous degeneration, unspecified eye: Secondary | ICD-10-CM

## 2012-03-06 ENCOUNTER — Other Ambulatory Visit (HOSPITAL_COMMUNITY): Payer: Self-pay | Admitting: Nephrology

## 2012-03-06 DIAGNOSIS — N289 Disorder of kidney and ureter, unspecified: Secondary | ICD-10-CM

## 2012-03-12 ENCOUNTER — Encounter (INDEPENDENT_AMBULATORY_CARE_PROVIDER_SITE_OTHER): Payer: Medicare Other | Admitting: Ophthalmology

## 2012-03-12 DIAGNOSIS — H353290 Exudative age-related macular degeneration, unspecified eye, stage unspecified: Secondary | ICD-10-CM

## 2012-03-12 DIAGNOSIS — I10 Essential (primary) hypertension: Secondary | ICD-10-CM

## 2012-03-12 DIAGNOSIS — H3530 Unspecified macular degeneration: Secondary | ICD-10-CM

## 2012-03-12 DIAGNOSIS — H35039 Hypertensive retinopathy, unspecified eye: Secondary | ICD-10-CM

## 2012-03-12 DIAGNOSIS — H43819 Vitreous degeneration, unspecified eye: Secondary | ICD-10-CM

## 2012-04-23 ENCOUNTER — Encounter (INDEPENDENT_AMBULATORY_CARE_PROVIDER_SITE_OTHER): Payer: Medicare Other | Admitting: Ophthalmology

## 2012-04-23 DIAGNOSIS — H353290 Exudative age-related macular degeneration, unspecified eye, stage unspecified: Secondary | ICD-10-CM

## 2012-04-23 DIAGNOSIS — H43819 Vitreous degeneration, unspecified eye: Secondary | ICD-10-CM

## 2012-04-23 DIAGNOSIS — H35039 Hypertensive retinopathy, unspecified eye: Secondary | ICD-10-CM

## 2012-04-23 DIAGNOSIS — I10 Essential (primary) hypertension: Secondary | ICD-10-CM

## 2012-04-23 DIAGNOSIS — H3530 Unspecified macular degeneration: Secondary | ICD-10-CM

## 2012-05-07 ENCOUNTER — Ambulatory Visit (HOSPITAL_COMMUNITY)
Admission: RE | Admit: 2012-05-07 | Discharge: 2012-05-07 | Disposition: A | Discharge status: Home / Self Care | Payer: Medicare Other | Source: Ambulatory Visit | Attending: Nephrology | Admitting: Nephrology

## 2012-05-07 DIAGNOSIS — N289 Disorder of kidney and ureter, unspecified: Secondary | ICD-10-CM | POA: Insufficient documentation

## 2012-06-04 ENCOUNTER — Encounter (INDEPENDENT_AMBULATORY_CARE_PROVIDER_SITE_OTHER): Payer: Medicare Other | Admitting: Ophthalmology

## 2012-06-04 DIAGNOSIS — H353290 Exudative age-related macular degeneration, unspecified eye, stage unspecified: Secondary | ICD-10-CM

## 2012-06-04 DIAGNOSIS — H3530 Unspecified macular degeneration: Secondary | ICD-10-CM

## 2012-06-04 DIAGNOSIS — H35039 Hypertensive retinopathy, unspecified eye: Secondary | ICD-10-CM

## 2012-06-04 DIAGNOSIS — I10 Essential (primary) hypertension: Secondary | ICD-10-CM

## 2012-07-16 ENCOUNTER — Encounter (INDEPENDENT_AMBULATORY_CARE_PROVIDER_SITE_OTHER): Payer: Medicare Other | Admitting: Ophthalmology

## 2012-07-16 DIAGNOSIS — H35039 Hypertensive retinopathy, unspecified eye: Secondary | ICD-10-CM

## 2012-07-16 DIAGNOSIS — I10 Essential (primary) hypertension: Secondary | ICD-10-CM

## 2012-07-16 DIAGNOSIS — H3530 Unspecified macular degeneration: Secondary | ICD-10-CM

## 2012-07-16 DIAGNOSIS — H353290 Exudative age-related macular degeneration, unspecified eye, stage unspecified: Secondary | ICD-10-CM

## 2012-08-27 ENCOUNTER — Encounter (INDEPENDENT_AMBULATORY_CARE_PROVIDER_SITE_OTHER): Payer: Medicare Other | Admitting: Ophthalmology

## 2012-08-27 DIAGNOSIS — H43819 Vitreous degeneration, unspecified eye: Secondary | ICD-10-CM

## 2012-08-27 DIAGNOSIS — H3530 Unspecified macular degeneration: Secondary | ICD-10-CM

## 2012-08-27 DIAGNOSIS — H35039 Hypertensive retinopathy, unspecified eye: Secondary | ICD-10-CM

## 2012-08-27 DIAGNOSIS — H353290 Exudative age-related macular degeneration, unspecified eye, stage unspecified: Secondary | ICD-10-CM

## 2012-08-27 DIAGNOSIS — I10 Essential (primary) hypertension: Secondary | ICD-10-CM

## 2012-10-08 ENCOUNTER — Encounter (INDEPENDENT_AMBULATORY_CARE_PROVIDER_SITE_OTHER): Payer: Medicare Other | Admitting: Ophthalmology

## 2012-10-08 DIAGNOSIS — H35039 Hypertensive retinopathy, unspecified eye: Secondary | ICD-10-CM

## 2012-10-08 DIAGNOSIS — H3530 Unspecified macular degeneration: Secondary | ICD-10-CM

## 2012-10-08 DIAGNOSIS — H353290 Exudative age-related macular degeneration, unspecified eye, stage unspecified: Secondary | ICD-10-CM

## 2012-10-08 DIAGNOSIS — I10 Essential (primary) hypertension: Secondary | ICD-10-CM

## 2012-11-19 ENCOUNTER — Encounter (INDEPENDENT_AMBULATORY_CARE_PROVIDER_SITE_OTHER): Payer: Medicare Other | Admitting: Ophthalmology

## 2012-11-19 DIAGNOSIS — I10 Essential (primary) hypertension: Secondary | ICD-10-CM

## 2012-11-19 DIAGNOSIS — H35039 Hypertensive retinopathy, unspecified eye: Secondary | ICD-10-CM

## 2012-11-19 DIAGNOSIS — H3530 Unspecified macular degeneration: Secondary | ICD-10-CM

## 2012-11-19 DIAGNOSIS — H43819 Vitreous degeneration, unspecified eye: Secondary | ICD-10-CM

## 2012-11-19 DIAGNOSIS — H353290 Exudative age-related macular degeneration, unspecified eye, stage unspecified: Secondary | ICD-10-CM

## 2012-12-28 ENCOUNTER — Encounter (INDEPENDENT_AMBULATORY_CARE_PROVIDER_SITE_OTHER): Payer: Medicare Other | Admitting: Ophthalmology

## 2012-12-28 DIAGNOSIS — H43819 Vitreous degeneration, unspecified eye: Secondary | ICD-10-CM

## 2012-12-28 DIAGNOSIS — H353290 Exudative age-related macular degeneration, unspecified eye, stage unspecified: Secondary | ICD-10-CM

## 2012-12-28 DIAGNOSIS — H3530 Unspecified macular degeneration: Secondary | ICD-10-CM

## 2012-12-28 DIAGNOSIS — I10 Essential (primary) hypertension: Secondary | ICD-10-CM

## 2012-12-28 DIAGNOSIS — H35039 Hypertensive retinopathy, unspecified eye: Secondary | ICD-10-CM

## 2013-02-04 ENCOUNTER — Encounter (INDEPENDENT_AMBULATORY_CARE_PROVIDER_SITE_OTHER): Payer: Medicare Other | Admitting: Ophthalmology

## 2013-02-04 DIAGNOSIS — H35039 Hypertensive retinopathy, unspecified eye: Secondary | ICD-10-CM

## 2013-02-04 DIAGNOSIS — H353290 Exudative age-related macular degeneration, unspecified eye, stage unspecified: Secondary | ICD-10-CM

## 2013-02-04 DIAGNOSIS — I10 Essential (primary) hypertension: Secondary | ICD-10-CM

## 2013-02-04 DIAGNOSIS — H3530 Unspecified macular degeneration: Secondary | ICD-10-CM

## 2013-02-04 DIAGNOSIS — H43819 Vitreous degeneration, unspecified eye: Secondary | ICD-10-CM

## 2013-03-11 ENCOUNTER — Encounter (INDEPENDENT_AMBULATORY_CARE_PROVIDER_SITE_OTHER): Payer: Medicare Other | Admitting: Ophthalmology

## 2013-03-11 DIAGNOSIS — H35039 Hypertensive retinopathy, unspecified eye: Secondary | ICD-10-CM

## 2013-03-11 DIAGNOSIS — H3530 Unspecified macular degeneration: Secondary | ICD-10-CM

## 2013-03-11 DIAGNOSIS — I10 Essential (primary) hypertension: Secondary | ICD-10-CM

## 2013-03-11 DIAGNOSIS — H43819 Vitreous degeneration, unspecified eye: Secondary | ICD-10-CM

## 2013-03-11 DIAGNOSIS — H353290 Exudative age-related macular degeneration, unspecified eye, stage unspecified: Secondary | ICD-10-CM

## 2013-04-15 ENCOUNTER — Encounter (INDEPENDENT_AMBULATORY_CARE_PROVIDER_SITE_OTHER): Payer: Medicare Other | Admitting: Ophthalmology

## 2013-04-15 DIAGNOSIS — H353290 Exudative age-related macular degeneration, unspecified eye, stage unspecified: Secondary | ICD-10-CM

## 2013-04-15 DIAGNOSIS — I10 Essential (primary) hypertension: Secondary | ICD-10-CM

## 2013-04-15 DIAGNOSIS — H35039 Hypertensive retinopathy, unspecified eye: Secondary | ICD-10-CM

## 2013-04-15 DIAGNOSIS — H43819 Vitreous degeneration, unspecified eye: Secondary | ICD-10-CM

## 2013-04-15 DIAGNOSIS — E11319 Type 2 diabetes mellitus with unspecified diabetic retinopathy without macular edema: Secondary | ICD-10-CM

## 2013-04-15 DIAGNOSIS — H3530 Unspecified macular degeneration: Secondary | ICD-10-CM

## 2013-05-20 ENCOUNTER — Encounter (INDEPENDENT_AMBULATORY_CARE_PROVIDER_SITE_OTHER): Payer: Medicare Other | Admitting: Ophthalmology

## 2013-05-20 DIAGNOSIS — H3530 Unspecified macular degeneration: Secondary | ICD-10-CM

## 2013-05-20 DIAGNOSIS — H43819 Vitreous degeneration, unspecified eye: Secondary | ICD-10-CM

## 2013-05-20 DIAGNOSIS — I10 Essential (primary) hypertension: Secondary | ICD-10-CM

## 2013-05-20 DIAGNOSIS — H35039 Hypertensive retinopathy, unspecified eye: Secondary | ICD-10-CM

## 2013-05-20 DIAGNOSIS — H353290 Exudative age-related macular degeneration, unspecified eye, stage unspecified: Secondary | ICD-10-CM

## 2013-06-24 ENCOUNTER — Encounter (INDEPENDENT_AMBULATORY_CARE_PROVIDER_SITE_OTHER): Payer: Medicare Other | Admitting: Ophthalmology

## 2013-06-24 DIAGNOSIS — H43819 Vitreous degeneration, unspecified eye: Secondary | ICD-10-CM

## 2013-06-24 DIAGNOSIS — I10 Essential (primary) hypertension: Secondary | ICD-10-CM

## 2013-06-24 DIAGNOSIS — H353290 Exudative age-related macular degeneration, unspecified eye, stage unspecified: Secondary | ICD-10-CM

## 2013-06-24 DIAGNOSIS — H35039 Hypertensive retinopathy, unspecified eye: Secondary | ICD-10-CM

## 2013-06-24 DIAGNOSIS — H3530 Unspecified macular degeneration: Secondary | ICD-10-CM

## 2013-07-04 ENCOUNTER — Other Ambulatory Visit: Payer: Self-pay | Admitting: Dermatology

## 2013-07-29 ENCOUNTER — Encounter (INDEPENDENT_AMBULATORY_CARE_PROVIDER_SITE_OTHER): Payer: Medicare Other | Admitting: Ophthalmology

## 2013-07-29 DIAGNOSIS — H3530 Unspecified macular degeneration: Secondary | ICD-10-CM

## 2013-07-29 DIAGNOSIS — I10 Essential (primary) hypertension: Secondary | ICD-10-CM

## 2013-07-29 DIAGNOSIS — H353290 Exudative age-related macular degeneration, unspecified eye, stage unspecified: Secondary | ICD-10-CM

## 2013-07-29 DIAGNOSIS — H43819 Vitreous degeneration, unspecified eye: Secondary | ICD-10-CM

## 2013-07-29 DIAGNOSIS — H35039 Hypertensive retinopathy, unspecified eye: Secondary | ICD-10-CM

## 2013-09-02 ENCOUNTER — Encounter (INDEPENDENT_AMBULATORY_CARE_PROVIDER_SITE_OTHER): Payer: Medicare Other | Admitting: Ophthalmology

## 2013-09-02 DIAGNOSIS — I10 Essential (primary) hypertension: Secondary | ICD-10-CM

## 2013-09-02 DIAGNOSIS — H43819 Vitreous degeneration, unspecified eye: Secondary | ICD-10-CM

## 2013-09-02 DIAGNOSIS — H353290 Exudative age-related macular degeneration, unspecified eye, stage unspecified: Secondary | ICD-10-CM

## 2013-09-02 DIAGNOSIS — H35039 Hypertensive retinopathy, unspecified eye: Secondary | ICD-10-CM

## 2013-09-02 DIAGNOSIS — H3530 Unspecified macular degeneration: Secondary | ICD-10-CM

## 2013-10-07 ENCOUNTER — Encounter (INDEPENDENT_AMBULATORY_CARE_PROVIDER_SITE_OTHER): Payer: Medicare Other | Admitting: Ophthalmology

## 2013-10-07 DIAGNOSIS — I10 Essential (primary) hypertension: Secondary | ICD-10-CM

## 2013-10-07 DIAGNOSIS — H353290 Exudative age-related macular degeneration, unspecified eye, stage unspecified: Secondary | ICD-10-CM

## 2013-10-07 DIAGNOSIS — H43819 Vitreous degeneration, unspecified eye: Secondary | ICD-10-CM

## 2013-10-07 DIAGNOSIS — H35039 Hypertensive retinopathy, unspecified eye: Secondary | ICD-10-CM

## 2013-10-07 DIAGNOSIS — H3530 Unspecified macular degeneration: Secondary | ICD-10-CM

## 2013-11-11 ENCOUNTER — Encounter (INDEPENDENT_AMBULATORY_CARE_PROVIDER_SITE_OTHER): Payer: Medicare Other | Admitting: Ophthalmology

## 2013-11-11 DIAGNOSIS — H3530 Unspecified macular degeneration: Secondary | ICD-10-CM

## 2013-11-11 DIAGNOSIS — I10 Essential (primary) hypertension: Secondary | ICD-10-CM

## 2013-11-11 DIAGNOSIS — H35039 Hypertensive retinopathy, unspecified eye: Secondary | ICD-10-CM

## 2013-11-11 DIAGNOSIS — H43819 Vitreous degeneration, unspecified eye: Secondary | ICD-10-CM

## 2013-11-11 DIAGNOSIS — H353290 Exudative age-related macular degeneration, unspecified eye, stage unspecified: Secondary | ICD-10-CM

## 2013-12-16 ENCOUNTER — Encounter (INDEPENDENT_AMBULATORY_CARE_PROVIDER_SITE_OTHER): Payer: Medicare Other | Admitting: Ophthalmology

## 2013-12-16 DIAGNOSIS — H35039 Hypertensive retinopathy, unspecified eye: Secondary | ICD-10-CM

## 2013-12-16 DIAGNOSIS — I10 Essential (primary) hypertension: Secondary | ICD-10-CM

## 2013-12-16 DIAGNOSIS — H43819 Vitreous degeneration, unspecified eye: Secondary | ICD-10-CM

## 2013-12-16 DIAGNOSIS — H353290 Exudative age-related macular degeneration, unspecified eye, stage unspecified: Secondary | ICD-10-CM

## 2013-12-16 DIAGNOSIS — H3530 Unspecified macular degeneration: Secondary | ICD-10-CM

## 2014-01-20 ENCOUNTER — Encounter (INDEPENDENT_AMBULATORY_CARE_PROVIDER_SITE_OTHER): Payer: Medicare Other | Admitting: Ophthalmology

## 2014-01-20 DIAGNOSIS — I10 Essential (primary) hypertension: Secondary | ICD-10-CM

## 2014-01-20 DIAGNOSIS — H3530 Unspecified macular degeneration: Secondary | ICD-10-CM

## 2014-01-20 DIAGNOSIS — H353290 Exudative age-related macular degeneration, unspecified eye, stage unspecified: Secondary | ICD-10-CM

## 2014-01-20 DIAGNOSIS — H35039 Hypertensive retinopathy, unspecified eye: Secondary | ICD-10-CM

## 2014-01-20 DIAGNOSIS — H43819 Vitreous degeneration, unspecified eye: Secondary | ICD-10-CM

## 2014-02-24 ENCOUNTER — Encounter (INDEPENDENT_AMBULATORY_CARE_PROVIDER_SITE_OTHER): Payer: Medicare Other | Admitting: Ophthalmology

## 2014-02-24 DIAGNOSIS — H3532 Exudative age-related macular degeneration: Secondary | ICD-10-CM

## 2014-02-24 DIAGNOSIS — H43813 Vitreous degeneration, bilateral: Secondary | ICD-10-CM

## 2014-02-24 DIAGNOSIS — H35033 Hypertensive retinopathy, bilateral: Secondary | ICD-10-CM

## 2014-02-24 DIAGNOSIS — I10 Essential (primary) hypertension: Secondary | ICD-10-CM

## 2014-02-24 DIAGNOSIS — H3531 Nonexudative age-related macular degeneration: Secondary | ICD-10-CM

## 2014-03-31 ENCOUNTER — Encounter (INDEPENDENT_AMBULATORY_CARE_PROVIDER_SITE_OTHER): Payer: Medicare Other | Admitting: Ophthalmology

## 2014-03-31 DIAGNOSIS — H3532 Exudative age-related macular degeneration: Secondary | ICD-10-CM

## 2014-03-31 DIAGNOSIS — H43813 Vitreous degeneration, bilateral: Secondary | ICD-10-CM

## 2014-03-31 DIAGNOSIS — I10 Essential (primary) hypertension: Secondary | ICD-10-CM

## 2014-03-31 DIAGNOSIS — H35033 Hypertensive retinopathy, bilateral: Secondary | ICD-10-CM

## 2014-03-31 DIAGNOSIS — H3531 Nonexudative age-related macular degeneration: Secondary | ICD-10-CM

## 2014-05-07 ENCOUNTER — Encounter (INDEPENDENT_AMBULATORY_CARE_PROVIDER_SITE_OTHER): Payer: Medicare Other | Admitting: Ophthalmology

## 2014-05-07 DIAGNOSIS — I10 Essential (primary) hypertension: Secondary | ICD-10-CM

## 2014-05-07 DIAGNOSIS — H3532 Exudative age-related macular degeneration: Secondary | ICD-10-CM

## 2014-05-07 DIAGNOSIS — H43813 Vitreous degeneration, bilateral: Secondary | ICD-10-CM

## 2014-05-07 DIAGNOSIS — H35033 Hypertensive retinopathy, bilateral: Secondary | ICD-10-CM

## 2014-05-07 DIAGNOSIS — H3531 Nonexudative age-related macular degeneration: Secondary | ICD-10-CM

## 2014-06-09 ENCOUNTER — Encounter (INDEPENDENT_AMBULATORY_CARE_PROVIDER_SITE_OTHER): Payer: Medicare Other | Admitting: Ophthalmology

## 2014-06-09 DIAGNOSIS — H3531 Nonexudative age-related macular degeneration: Secondary | ICD-10-CM

## 2014-06-09 DIAGNOSIS — H43813 Vitreous degeneration, bilateral: Secondary | ICD-10-CM

## 2014-06-09 DIAGNOSIS — H3532 Exudative age-related macular degeneration: Secondary | ICD-10-CM

## 2014-06-09 DIAGNOSIS — I10 Essential (primary) hypertension: Secondary | ICD-10-CM

## 2014-07-14 ENCOUNTER — Encounter (INDEPENDENT_AMBULATORY_CARE_PROVIDER_SITE_OTHER): Payer: Medicare Other | Admitting: Ophthalmology

## 2014-07-14 DIAGNOSIS — H35033 Hypertensive retinopathy, bilateral: Secondary | ICD-10-CM | POA: Diagnosis not present

## 2014-07-14 DIAGNOSIS — I10 Essential (primary) hypertension: Secondary | ICD-10-CM

## 2014-07-14 DIAGNOSIS — H3531 Nonexudative age-related macular degeneration: Secondary | ICD-10-CM | POA: Diagnosis not present

## 2014-07-14 DIAGNOSIS — H3532 Exudative age-related macular degeneration: Secondary | ICD-10-CM | POA: Diagnosis not present

## 2014-07-14 DIAGNOSIS — H43813 Vitreous degeneration, bilateral: Secondary | ICD-10-CM | POA: Diagnosis not present

## 2014-08-18 ENCOUNTER — Encounter (INDEPENDENT_AMBULATORY_CARE_PROVIDER_SITE_OTHER): Payer: Medicare Other | Admitting: Ophthalmology

## 2014-08-18 DIAGNOSIS — H35033 Hypertensive retinopathy, bilateral: Secondary | ICD-10-CM

## 2014-08-18 DIAGNOSIS — H43813 Vitreous degeneration, bilateral: Secondary | ICD-10-CM

## 2014-08-18 DIAGNOSIS — H3532 Exudative age-related macular degeneration: Secondary | ICD-10-CM | POA: Diagnosis not present

## 2014-08-18 DIAGNOSIS — I10 Essential (primary) hypertension: Secondary | ICD-10-CM | POA: Diagnosis not present

## 2014-08-25 ENCOUNTER — Other Ambulatory Visit: Payer: Self-pay | Admitting: Dermatology

## 2014-09-22 ENCOUNTER — Encounter (INDEPENDENT_AMBULATORY_CARE_PROVIDER_SITE_OTHER): Payer: Medicare Other | Admitting: Ophthalmology

## 2014-09-22 DIAGNOSIS — H3532 Exudative age-related macular degeneration: Secondary | ICD-10-CM | POA: Diagnosis not present

## 2014-09-22 DIAGNOSIS — H35033 Hypertensive retinopathy, bilateral: Secondary | ICD-10-CM

## 2014-09-22 DIAGNOSIS — H43813 Vitreous degeneration, bilateral: Secondary | ICD-10-CM | POA: Diagnosis not present

## 2014-09-22 DIAGNOSIS — I10 Essential (primary) hypertension: Secondary | ICD-10-CM | POA: Diagnosis not present

## 2014-10-20 ENCOUNTER — Encounter (INDEPENDENT_AMBULATORY_CARE_PROVIDER_SITE_OTHER): Payer: Medicare Other | Admitting: Ophthalmology

## 2014-10-20 DIAGNOSIS — H3532 Exudative age-related macular degeneration: Secondary | ICD-10-CM | POA: Diagnosis not present

## 2014-10-20 DIAGNOSIS — H35033 Hypertensive retinopathy, bilateral: Secondary | ICD-10-CM | POA: Diagnosis not present

## 2014-10-20 DIAGNOSIS — H43813 Vitreous degeneration, bilateral: Secondary | ICD-10-CM | POA: Diagnosis not present

## 2014-10-20 DIAGNOSIS — I10 Essential (primary) hypertension: Secondary | ICD-10-CM | POA: Diagnosis not present

## 2014-11-24 ENCOUNTER — Encounter (INDEPENDENT_AMBULATORY_CARE_PROVIDER_SITE_OTHER): Payer: Medicare Other | Admitting: Ophthalmology

## 2014-12-26 ENCOUNTER — Encounter (HOSPITAL_BASED_OUTPATIENT_CLINIC_OR_DEPARTMENT_OTHER): Payer: Medicare Other | Attending: Internal Medicine

## 2014-12-26 DIAGNOSIS — L570 Actinic keratosis: Secondary | ICD-10-CM | POA: Diagnosis not present

## 2014-12-26 DIAGNOSIS — E119 Type 2 diabetes mellitus without complications: Secondary | ICD-10-CM | POA: Diagnosis not present

## 2014-12-29 ENCOUNTER — Encounter (INDEPENDENT_AMBULATORY_CARE_PROVIDER_SITE_OTHER): Payer: Medicare Other | Admitting: Ophthalmology

## 2014-12-29 DIAGNOSIS — H3531 Nonexudative age-related macular degeneration: Secondary | ICD-10-CM

## 2014-12-29 DIAGNOSIS — H3532 Exudative age-related macular degeneration: Secondary | ICD-10-CM

## 2014-12-29 DIAGNOSIS — H35033 Hypertensive retinopathy, bilateral: Secondary | ICD-10-CM

## 2014-12-29 DIAGNOSIS — I10 Essential (primary) hypertension: Secondary | ICD-10-CM | POA: Diagnosis not present

## 2014-12-29 DIAGNOSIS — H43813 Vitreous degeneration, bilateral: Secondary | ICD-10-CM | POA: Diagnosis not present

## 2015-02-04 ENCOUNTER — Encounter (INDEPENDENT_AMBULATORY_CARE_PROVIDER_SITE_OTHER): Payer: Medicare Other | Admitting: Ophthalmology

## 2015-02-04 DIAGNOSIS — H43813 Vitreous degeneration, bilateral: Secondary | ICD-10-CM

## 2015-02-04 DIAGNOSIS — I10 Essential (primary) hypertension: Secondary | ICD-10-CM

## 2015-02-04 DIAGNOSIS — H353231 Exudative age-related macular degeneration, bilateral, with active choroidal neovascularization: Secondary | ICD-10-CM

## 2015-02-04 DIAGNOSIS — H35033 Hypertensive retinopathy, bilateral: Secondary | ICD-10-CM

## 2015-03-09 ENCOUNTER — Encounter (INDEPENDENT_AMBULATORY_CARE_PROVIDER_SITE_OTHER): Payer: Medicare Other | Admitting: Ophthalmology

## 2015-03-09 DIAGNOSIS — H35033 Hypertensive retinopathy, bilateral: Secondary | ICD-10-CM

## 2015-03-09 DIAGNOSIS — H353231 Exudative age-related macular degeneration, bilateral, with active choroidal neovascularization: Secondary | ICD-10-CM | POA: Diagnosis not present

## 2015-03-09 DIAGNOSIS — I10 Essential (primary) hypertension: Secondary | ICD-10-CM

## 2015-03-09 DIAGNOSIS — H43813 Vitreous degeneration, bilateral: Secondary | ICD-10-CM | POA: Diagnosis not present

## 2015-04-10 ENCOUNTER — Encounter (INDEPENDENT_AMBULATORY_CARE_PROVIDER_SITE_OTHER): Payer: Medicare Other | Admitting: Ophthalmology

## 2015-04-10 DIAGNOSIS — H43813 Vitreous degeneration, bilateral: Secondary | ICD-10-CM | POA: Diagnosis not present

## 2015-04-10 DIAGNOSIS — H353231 Exudative age-related macular degeneration, bilateral, with active choroidal neovascularization: Secondary | ICD-10-CM

## 2015-04-10 DIAGNOSIS — I10 Essential (primary) hypertension: Secondary | ICD-10-CM

## 2015-04-10 DIAGNOSIS — H35033 Hypertensive retinopathy, bilateral: Secondary | ICD-10-CM

## 2015-04-10 DIAGNOSIS — H35371 Puckering of macula, right eye: Secondary | ICD-10-CM | POA: Diagnosis not present

## 2015-05-13 ENCOUNTER — Encounter (INDEPENDENT_AMBULATORY_CARE_PROVIDER_SITE_OTHER): Payer: Medicare Other | Admitting: Ophthalmology

## 2015-05-13 DIAGNOSIS — H353231 Exudative age-related macular degeneration, bilateral, with active choroidal neovascularization: Secondary | ICD-10-CM

## 2015-05-13 DIAGNOSIS — H35033 Hypertensive retinopathy, bilateral: Secondary | ICD-10-CM

## 2015-05-13 DIAGNOSIS — H43813 Vitreous degeneration, bilateral: Secondary | ICD-10-CM

## 2015-05-13 DIAGNOSIS — I10 Essential (primary) hypertension: Secondary | ICD-10-CM | POA: Diagnosis not present

## 2015-05-24 ENCOUNTER — Ambulatory Visit (INDEPENDENT_AMBULATORY_CARE_PROVIDER_SITE_OTHER): Payer: Medicare Other | Admitting: Family Medicine

## 2015-05-24 ENCOUNTER — Encounter: Payer: Self-pay | Admitting: Family Medicine

## 2015-05-24 VITALS — BP 94/44 | HR 107 | Temp 98.3°F | Resp 20 | Ht 65.0 in | Wt 132.0 lb

## 2015-05-24 DIAGNOSIS — R21 Rash and other nonspecific skin eruption: Secondary | ICD-10-CM

## 2015-05-24 DIAGNOSIS — J209 Acute bronchitis, unspecified: Secondary | ICD-10-CM | POA: Diagnosis not present

## 2015-05-24 MED ORDER — METHYLPREDNISOLONE ACETATE 80 MG/ML IJ SUSP
80.0000 mg | Freq: Once | INTRAMUSCULAR | Status: AC
  Administered 2015-05-24: 80 mg via INTRAMUSCULAR

## 2015-05-24 MED ORDER — MUPIROCIN 2 % EX OINT: 1.0000 "application " | TOPICAL_OINTMENT | Freq: Three times a day (TID) | CUTANEOUS | Status: DC

## 2015-05-24 MED ORDER — CEFTRIAXONE SODIUM 1 G IJ SOLR
1.0000 g | Freq: Once | INTRAMUSCULAR | Status: AC
  Administered 2015-05-24: 1 g via INTRAMUSCULAR

## 2015-05-24 MED ORDER — DOXYCYCLINE HYCLATE 100 MG PO TABS
100.0000 mg | ORAL_TABLET | Freq: Two times a day (BID) | ORAL | Status: DC
Start: 2015-05-24 — End: 2015-06-12

## 2015-05-24 NOTE — Patient Instructions (Signed)
Please return this coming Wednesday if not improving with respect to the cough. Otherwise I would like to see a week from Wednesday to monitor the rash

## 2015-05-24 NOTE — Progress Notes (Signed)
By signing my name below, I, Moises Blood, attest that this documentation has been prepared under the direction and in the presence of Robyn Haber, MD. Electronically Signed: Moises Blood, [place]. 05/24/2015 , 12:51 PM .  Patient was seen in room 13 .   Patient ID: [name] MRN: [id number], DOB: 1923/06/09, 82 y.o. Date of Encounter: 05/24/2015  Primary Physician: Woody Seller, MD  Chief Complaint:  Chief Complaint  Patient presents with  . Shortness of Breath    last night  . Fatigue  . Flank Pain    HPI:  Mario Hartman is a 82 y.o. male who presents to [facility] complaining of shortness of breath with cough last night. He wasn't able to breathe well at 4:00AM this morning. He denies smoking. He denies fever.   He has an infected area on his back that was noticed 2 years ago. He's been to 4 dermatologists. He was given a shot in the past for relief. But when the infection returned, the dermatologists gave him cream without relief.   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   History reviewed. No pertinent past medical history.   Home Meds: Prior to Admission medications   Not on File    Allergies: No Known Allergies  Social History   Social History  . Marital Status: Widowed    Spouse Name: N/A  . Number of Children: N/A  . Years of Education: N/A   Occupational History  . Not on file.   Social History Main Topics  . Smoking status: Never Smoker   . Smokeless tobacco: Not on file  . Alcohol Use: Not on file  . Drug Use: Not on file  . Sexual Activity: Not on file   Other Topics Concern  . Not on file   Social History Narrative  . No narrative on file     Review of Systems: Constitutional: negative for fever, chills, night sweats, weight changes; positive for fatigue  HEENT: negative for vision changes, hearing loss, congestion, rhinorrhea, ST, epistaxis, or sinus  pressure Cardiovascular: negative for chest pain or palpitations Respiratory: negative for hemoptysis, wheezing; positive for shortness of breath, cough Abdominal: negative for abdominal pain, nausea, vomiting, diarrhea, or constipation Dermatological: positive for rash (back)  Neurologic: negative for headache, dizziness, or syncope All other systems reviewed and are otherwise negative with the exception to those above and in the HPI.  Physical Exam: Blood pressure 94/44, pulse 107, temperature 98.3 F (36.8 C), resp. rate 20, height 5\' 5"  (1.651 m), weight 132 lb (59.875 kg), SpO2 90 %., Body mass index is 21.97 kg/(m^2). General: Well developed, well nourished, in no acute distress. Head: Normocephalic, atraumatic, eyes without discharge, sclera non-icteric, nares are without discharge. Bilateral auditory canals clear, TM's are without perforation, pearly grey and translucent with reflective cone of light bilaterally. Oral cavity moist, posterior pharynx without exudate, erythema, peritonsillar abscess, or post nasal drip.  Neck: Supple. No thyromegaly. Full ROM. No lymphadenopathy. Lungs: Clear bilaterally to auscultation without wheezes, rales, or rhonchi. Breathing is unlabored, Rales in lower bases Heart: RRR with S1 S2. No murmurs, rubs, or gallops appreciated. Msk:  Strength and tone normal for age. Extremities/Skin: Warm and dry. 9x10cm area of erythema, tender and has some wet exfoliation Neuro: Alert and oriented X 3. Moves all extremities spontaneously. Gait is normal. CNII-XII grossly in tact. Psych:  Responds to questions appropriately with a  normal affect.   Labs:  ASSESSMENT AND PLAN:  82 y.o. year old male with  This chart was scribed in my presence and reviewed by me personally.    ICD-9-CM ICD-10-CM   1. Rash and nonspecific skin eruption 782.1 R21 methylPREDNISolone acetate (DEPO-MEDROL) injection 80 mg     cefTRIAXone (ROCEPHIN) injection 1 g     doxycycline  (VIBRA-TABS) 100 MG tablet     mupirocin ointment (BACTROBAN) 2 %  2. Acute bronchitis, unspecified organism 466.0 J20.9 methylPREDNISolone acetate (DEPO-MEDROL) injection 80 mg     cefTRIAXone (ROCEPHIN) injection 1 g     doxycycline (VIBRA-TABS) 100 MG tablet     Signed, Robyn Haber, MD 05/24/2015 12:51 PM

## 2015-05-27 ENCOUNTER — Telehealth: Payer: Self-pay

## 2015-05-27 NOTE — Telephone Encounter (Signed)
Left message for pt to call back  °

## 2015-05-27 NOTE — Telephone Encounter (Signed)
DOES HE NEED MORE MEDICATION FOR HIS BACK - IT IS CLEARING UP AND PATIENT IS DOING MUCH BETTER.  OUT OF THE CREAM mupirocin ointment (BACTROBAN) 2 %    [phone]

## 2015-06-03 ENCOUNTER — Ambulatory Visit (INDEPENDENT_AMBULATORY_CARE_PROVIDER_SITE_OTHER): Payer: Medicare Other | Admitting: Physician Assistant

## 2015-06-03 VITALS — BP 140/62 | HR 84 | Temp 98.3°F | Resp 17 | Ht 65.0 in | Wt 130.0 lb

## 2015-06-03 DIAGNOSIS — L89101 Pressure ulcer of unspecified part of back, stage 1: Secondary | ICD-10-CM | POA: Diagnosis not present

## 2015-06-03 MED ORDER — SILVER SULFADIAZINE 1 % EX CREA: 1.0000 "application " | TOPICAL_CREAM | Freq: Every day | CUTANEOUS | Status: DC

## 2015-06-03 NOTE — Progress Notes (Signed)
   06/03/2015 4:22 PM   DOB: 05-21-1923 / MRN: [id number]  SUBJECTIVE:  Mario Hartman is a 82 y.o. male presenting for a recheck of bronchitis and rash.  Was seen by Dr. Carlean Jews on 05/24/15.  He was treated with steroids, ceftriaxone, and doxycycline.  Today he is feeling much better with regard to cough.  Denies fever, chills.    He complains of a sore on his back that has been present for the last two years.  Reports seeing two different dermatologist and has even had a punch biopsy to rule out malignancy. Reports it is somewhat better right now.    He has No Known Allergies.   He  has no past medical history on file.    He  reports that he has never smoked. He does not have any smokeless tobacco history on file. He  has no sexual activity history on file. The patient  has no past surgical history on file.  His family history is not on file.  Review of Systems  Constitutional: Negative for fever and chills.  Eyes: Negative for blurred vision.  Respiratory: Negative for cough and shortness of breath.   Cardiovascular: Negative for chest pain.  Gastrointestinal: Negative for nausea and abdominal pain.  Genitourinary: Negative for dysuria, urgency and frequency.  Musculoskeletal: Negative for myalgias.  Skin: Positive for rash. Negative for itching.  Neurological: Negative for dizziness, tingling and headaches.  Psychiatric/Behavioral: Negative for depression. The patient is not nervous/anxious.     Problem list and medications reviewed and updated by myself where necessary, and exist elsewhere in the encounter.   OBJECTIVE:  BP 140/62 mmHg  Pulse 84  Temp(Src) 98.3 F (36.8 C) (Oral)  Resp 17  Ht 5\' 5"  (1.651 m)  Wt 130 lb (58.968 kg)  BMI 21.63 kg/m2  Physical Exam  Constitutional: He is oriented to person, place, and time. He appears well-developed. He does not appear ill.  Eyes: Conjunctivae and EOM are normal. Pupils are equal, round, and reactive to light.  Cardiovascular:  Normal rate.   Pulmonary/Chest: Effort normal.  Abdominal: He exhibits no distension.  Musculoskeletal: Normal range of motion.  Neurological: He is alert and oriented to person, place, and time. No cranial nerve deficit. Coordination normal.  Skin: Skin is warm and dry. He is not diaphoretic.     Psychiatric: He has a normal mood and affect.  Nursing note and vitals reviewed.   No results found for this or any previous visit (from the past 72 hour(s)).  No results found.  ASSESSMENT AND PLAN  Mario Hartman was seen today for sore on back.  Diagnoses and all orders for this visit:  Pressure sore of lower back, stage I: Silvadene and wound care supplies Rx'd.  RTC in roughly 10 days for re-eval.    Other orders -     silver sulfADIAZINE (SILVADENE) 1 % cream; Apply 1 application topically daily.   The patient was advised to call or return to clinic if he does not see an improvement in symptoms or to seek the care of the closest emergency department if he worsens with the above plan.   Philis Fendt, MHS, PA-C [facility] 06/03/2015 4:22 PM

## 2015-06-08 ENCOUNTER — Encounter (INDEPENDENT_AMBULATORY_CARE_PROVIDER_SITE_OTHER): Payer: Medicare Other | Admitting: Ophthalmology

## 2015-06-08 DIAGNOSIS — H353231 Exudative age-related macular degeneration, bilateral, with active choroidal neovascularization: Secondary | ICD-10-CM | POA: Diagnosis not present

## 2015-06-08 DIAGNOSIS — H35033 Hypertensive retinopathy, bilateral: Secondary | ICD-10-CM | POA: Diagnosis not present

## 2015-06-08 DIAGNOSIS — I10 Essential (primary) hypertension: Secondary | ICD-10-CM | POA: Diagnosis not present

## 2015-06-08 DIAGNOSIS — H43813 Vitreous degeneration, bilateral: Secondary | ICD-10-CM | POA: Diagnosis not present

## 2015-06-12 ENCOUNTER — Ambulatory Visit (INDEPENDENT_AMBULATORY_CARE_PROVIDER_SITE_OTHER): Payer: Medicare Other | Admitting: Family Medicine

## 2015-06-12 VITALS — BP 150/60 | HR 72 | Temp 97.9°F | Resp 18 | Ht 63.5 in | Wt 129.2 lb

## 2015-06-12 DIAGNOSIS — L98499 Non-pressure chronic ulcer of skin of other sites with unspecified severity: Secondary | ICD-10-CM

## 2015-06-12 DIAGNOSIS — L89101 Pressure ulcer of unspecified part of back, stage 1: Secondary | ICD-10-CM

## 2015-06-12 NOTE — Progress Notes (Signed)
@UMFCLOGO @  By signing my name below, I, Raven Small, attest that this documentation has been prepared under the direction and in the presence of Robyn Haber, MD.  Electronically Signed: Thea Alken, ED Scribe. 06/12/2015. 11:23 AM.  Patient ID: ISAMI TORO MRN: [id number], DOB: 04-03-1924, 82 y.o. Date of Encounter: 06/12/2015, 11:23 AM  Primary Physician: Woody Seller, MD  Chief Complaint:  Chief Complaint  Patient presents with  . Wound Check    HPI: 82 y.o. year old male with history below presents for a wound check. He was seen here 9 days ago for a pressure sore that has been present for 2 years. Treated with Silvadene and was advised to return in 10 days for reevaluation. He denies area being painful. He states he has been seen by 4 dermatologist and has had a biopsy. He reports area will be fine for 3-4 days but will flare up.    History reviewed. No pertinent past medical history.   Home Meds: Prior to Admission medications   Medication Sig Start Date End Date Taking? Authorizing Provider  silver sulfADIAZINE (SILVADENE) 1 % cream Apply 1 application topically daily. 06/03/15  Yes Tereasa Coop, PA-C  mupirocin ointment (BACTROBAN) 2 % Place 1 application into the nose 3 (three) times daily. Patient not taking: Reported on 06/03/2015 05/24/15   Robyn Haber, MD    Allergies: No Known Allergies  Social History   Social History  . Marital Status: Widowed    Spouse Name: N/A  . Number of Children: N/A  . Years of Education: N/A   Occupational History  . Not on file.   Social History Main Topics  . Smoking status: Never Smoker   . Smokeless tobacco: Not on file  . Alcohol Use: Not on file  . Drug Use: Not on file  . Sexual Activity: Not on file   Other Topics Concern  . Not on file   Social History Narrative    Review of Systems: Constitutional: negative for chills, fever, night sweats, weight changes, or fatigue  HEENT: negative for vision  changes, hearing loss, congestion, rhinorrhea, ST, epistaxis, or sinus pressure Cardiovascular: negative for chest pain or palpitations Respiratory: negative for hemoptysis, wheezing, shortness of breath, or cough Abdominal: negative for abdominal pain, nausea, vomiting, diarrhea, or constipation Dermatological: negative for rash Neurologic: negative for headache, dizziness, or syncope All other systems reviewed and are otherwise negative with the exception to those above and in the HPI.   Physical Exam: Blood pressure 150/60, pulse 72, temperature 97.9 F (36.6 C), temperature source Oral, resp. rate 18, height 5' 3.5" (1.613 m), weight 129 lb 3.2 oz (58.605 kg), SpO2 92 %., Body mass index is 22.53 kg/(m^2). General: Well developed, well nourished, in no acute distress. Head: Normocephalic, atraumatic, eyes without discharge, sclera non-icteric, nares are without discharge. Bilateral auditory canals clear. Oral cavity moist  Neck: Supple. No thyromegaly. Full ROM. No lymphadenopathy. Lungs: Clear bilaterally to auscultation without wheezes, rales, or rhonchi. Breathing is unlabored. Heart: RRR with S1 S2. No murmurs, rubs, or gallops appreciated. Msk:  Strength and tone normal for age. Extremities/Skin: Warm and dry. No clubbing or cyanosis.Well healed violaceous 5 cm in diameter area on right para lumbar region.  Neuro: Alert and oriented X 3. Moves all extremities spontaneously. Gait is normal. CNII-XII grossly in tact. Psych:  Responds to questions appropriately with a normal affect.    ASSESSMENT AND PLAN:  82 y.o. year old male with chronic pressure sore on back This  chart was scribed in my presence and reviewed by me personally.    ICD-9-CM ICD-10-CM   1. Chronic skin ulcer, with unspecified severity ([place]) 707.9 L98.499   2. Pressure sore of lower back, stage I 707.03 L89.101    707.21       Signed, Robyn Haber, MD   Dressings were changes. He is to return to clinic  in 2 weeks for re evaluation.   Signed, Robyn Haber, MD 06/12/2015 11:23 AM

## 2015-07-06 ENCOUNTER — Encounter (INDEPENDENT_AMBULATORY_CARE_PROVIDER_SITE_OTHER): Payer: Medicare Other | Admitting: Ophthalmology

## 2015-07-06 DIAGNOSIS — I10 Essential (primary) hypertension: Secondary | ICD-10-CM | POA: Diagnosis not present

## 2015-07-06 DIAGNOSIS — H35033 Hypertensive retinopathy, bilateral: Secondary | ICD-10-CM

## 2015-07-06 DIAGNOSIS — H43813 Vitreous degeneration, bilateral: Secondary | ICD-10-CM

## 2015-07-06 DIAGNOSIS — D3131 Benign neoplasm of right choroid: Secondary | ICD-10-CM | POA: Diagnosis not present

## 2015-07-06 DIAGNOSIS — H353231 Exudative age-related macular degeneration, bilateral, with active choroidal neovascularization: Secondary | ICD-10-CM | POA: Diagnosis not present

## 2015-08-13 ENCOUNTER — Ambulatory Visit (INDEPENDENT_AMBULATORY_CARE_PROVIDER_SITE_OTHER): Payer: Medicare Other | Admitting: Family Medicine

## 2015-08-13 VITALS — BP 104/62 | HR 76 | Temp 97.3°F | Resp 18 | Ht 65.0 in | Wt 131.0 lb

## 2015-08-13 DIAGNOSIS — E119 Type 2 diabetes mellitus without complications: Secondary | ICD-10-CM

## 2015-08-13 DIAGNOSIS — R21 Rash and other nonspecific skin eruption: Secondary | ICD-10-CM | POA: Diagnosis not present

## 2015-08-13 DIAGNOSIS — L0291 Cutaneous abscess, unspecified: Secondary | ICD-10-CM

## 2015-08-13 DIAGNOSIS — L02212 Cutaneous abscess of back [any part, except buttock]: Secondary | ICD-10-CM

## 2015-08-13 DIAGNOSIS — L0390 Cellulitis, unspecified: Secondary | ICD-10-CM

## 2015-08-13 LAB — POCT CBC
GRANULOCYTE PERCENT: 57.6 % (ref 37–80)
HCT, POC: 42.4 % — AB (ref 43.5–53.7)
HEMOGLOBIN: 14.9 g/dL (ref 14.1–18.1)
LYMPH, POC: 2.5 (ref 0.6–3.4)
MCH, POC: 33.8 pg — AB (ref 27–31.2)
MCHC: 35 g/dL (ref 31.8–35.4)
MCV: 96.4 fL (ref 80–97)
MID (cbc): 0.7 (ref 0–0.9)
MPV: 7.2 fL (ref 0–99.8)
PLATELET COUNT, POC: 243 10*3/uL (ref 142–424)
POC GRANULOCYTE: 4.4 (ref 2–6.9)
POC LYMPH %: 33.4 % (ref 10–50)
POC MID %: 9 %M (ref 0–12)
RBC: 4.4 M/uL — AB (ref 4.69–6.13)
RDW, POC: 12.8 %
WBC: 7.6 10*3/uL (ref 4.6–10.2)

## 2015-08-13 LAB — GLUCOSE, POCT (MANUAL RESULT ENTRY): POC GLUCOSE: 112 mg/dL — AB (ref 70–99)

## 2015-08-13 MED ORDER — MUPIROCIN 2 % EX OINT: 1.0000 "application " | TOPICAL_OINTMENT | Freq: Three times a day (TID) | CUTANEOUS | Status: DC

## 2015-08-13 MED ORDER — DOXYCYCLINE HYCLATE 100 MG PO TABS: 100.0000 mg | ORAL_TABLET | Freq: Two times a day (BID) | ORAL | Status: DC

## 2015-08-13 NOTE — Patient Instructions (Addendum)
Continue to dress the wound with mupirocin  Take doxycycline 100 mg one twice daily for 10 days  Return in 2 weeks for a recheck. If it is not improving it should be biopsied.    IF you received an x-ray today, you will receive an invoice from [facility]. Please contact [facility] at [phone] with questions or concerns regarding your invoice.   IF you received labwork today, you will receive an invoice from [employer]. Please contact [facility] at [phone] with questions or concerns regarding your invoice.   Our billing staff will not be able to assist you with questions regarding bills from these companies.  You will be contacted with the lab results as soon as they are available. The fastest way to get your results is to activate your My Chart account. Instructions are located on the last page of this paperwork. If you have not heard from [place] regarding the results in 2 weeks, please contact this office.

## 2015-08-13 NOTE — Progress Notes (Signed)
Patient ID: Mario Hartman, male    DOB: 10/27/1923  Age: 82 y.o. MRN: [id number]  Chief Complaint  Patient presents with  . Mass    back, x 2 years    Subjective:   82 year old gentleman who lives alone. He has a chronic soreness right back for the last couple of years. He is seen 5 doctors for been given various lotions and ointments. He was treated with antibiotics for a few days one time. He says it just keeps on draining and being sore. He has a hard time saying it, has to get somebody to dress it since he lives alone. No known injuries.  In reviewing the old record it appears that he is had a basal cell carcinoma on his ear in the past. The dermatologist biopsied his back and it showed actinic keratosis.  Current allergies, medications, problem list, past/family and social histories reviewed.  Objective:  BP 104/62 mmHg  Pulse 76  Temp(Src) 97.3 F (36.3 C)  Resp 18  Ht 5\' 5"  (1.651 m)  Wt 131 lb (59.421 kg)  BMI 21.80 kg/m2  SpO2 93%  Chronic inflamed area about 7 cm in diameter. There are about 4 or 5 little punctate wounds that are draining. Culture was taken. When the area is cultured leads. Not actually yellow pus, but a serous crusting. Patient is diabetic.  Assessment & Plan:   Assessment: 1. Abscess of back   2. Cellulitis and abscess   3. Diabetes mellitus without complication ([place])   4. Rash and nonspecific skin eruption       Plan: Treat with antibiotics and have him return in 2 weeks. If it is not doing better probably should have a punch biopsy performed again. This may be a skin cancer that keeps it this way, even though he is had a biopsy was past. Might need to be sent back to the dermatologist and/or sent to the wound center eventually.  Orders Placed This Encounter  Procedures  . Wound culture    Order Specific Question:  Source    Answer:  right back  . POCT CBC  . POCT glucose (manual entry)    Meds ordered this encounter  Medications  .  doxycycline (VIBRA-TABS) 100 MG tablet    Sig: Take 1 tablet (100 mg total) by mouth 2 (two) times daily.    Dispense:  20 tablet    Refill:  0  . mupirocin ointment (BACTROBAN) 2 %    Sig: Place 1 application into the nose 3 (three) times daily.    Dispense:  30 g    Refill:  1    Results for orders placed or performed in visit on 08/13/15  POCT CBC  Result Value Ref Range   WBC 7.6 4.6 - 10.2 K/uL   Lymph, poc 2.5 0.6 - 3.4   POC LYMPH PERCENT 33.4 10 - 50 %L   MID (cbc) 0.7 0 - 0.9   POC MID % 9.0 0 - 12 %M   POC Granulocyte 4.4 2 - 6.9   Granulocyte percent 57.6 37 - 80 %G   RBC 4.40 (A) 4.69 - 6.13 M/uL   Hemoglobin 14.9 14.1 - 18.1 g/dL   HCT, POC 42.4 (A) 43.5 - 53.7 %   MCV 96.4 80 - 97 fL   MCH, POC 33.8 (A) 27 - 31.2 pg   MCHC 35.0 31.8 - 35.4 g/dL   RDW, POC 12.8 %   Platelet Count, POC 243 142 - 424 K/uL  MPV 7.2 0 - 99.8 fL  POCT glucose (manual entry)  Result Value Ref Range   POC Glucose 112 (A) 70 - 99 mg/dl        Patient Instructions   Continue to dress the wound with mupirocin  Take doxycycline 100 mg one twice daily for 10 days  Return in 2 weeks for a recheck. If it is not improving it should be biopsied.    IF you received an x-ray today, you will receive an invoice from [facility]. Please contact [facility] at [phone] with questions or concerns regarding your invoice.   IF you received labwork today, you will receive an invoice from [employer]. Please contact [facility] at [phone] with questions or concerns regarding your invoice.   Our billing staff will not be able to assist you with questions regarding bills from these companies.  You will be contacted with the lab results as soon as they are available. The fastest way to get your results is to activate your My Chart account. Instructions are located on the last page of this paperwork. If you have not heard from [place] regarding the  results in 2 weeks, please contact this office.          Return in about 2 weeks (around 08/27/2015).   Yvanna Vidas, MD 08/13/2015

## 2015-08-16 LAB — WOUND CULTURE
GRAM STAIN: NONE SEEN
Gram Stain: NONE SEEN
Gram Stain: NONE SEEN
ORGANISM ID, BACTERIA: NO GROWTH

## 2015-08-17 ENCOUNTER — Encounter (INDEPENDENT_AMBULATORY_CARE_PROVIDER_SITE_OTHER): Payer: Medicare Other | Admitting: Ophthalmology

## 2015-08-17 DIAGNOSIS — I10 Essential (primary) hypertension: Secondary | ICD-10-CM

## 2015-08-17 DIAGNOSIS — D3131 Benign neoplasm of right choroid: Secondary | ICD-10-CM

## 2015-08-17 DIAGNOSIS — H43813 Vitreous degeneration, bilateral: Secondary | ICD-10-CM

## 2015-08-17 DIAGNOSIS — H353231 Exudative age-related macular degeneration, bilateral, with active choroidal neovascularization: Secondary | ICD-10-CM | POA: Diagnosis not present

## 2015-08-17 DIAGNOSIS — H35033 Hypertensive retinopathy, bilateral: Secondary | ICD-10-CM

## 2015-08-27 ENCOUNTER — Ambulatory Visit (INDEPENDENT_AMBULATORY_CARE_PROVIDER_SITE_OTHER): Payer: Medicare Other | Admitting: Family Medicine

## 2015-08-27 VITALS — BP 136/70 | HR 75 | Temp 97.7°F

## 2015-08-27 DIAGNOSIS — L309 Dermatitis, unspecified: Secondary | ICD-10-CM

## 2015-08-27 DIAGNOSIS — L98499 Non-pressure chronic ulcer of skin of other sites with unspecified severity: Secondary | ICD-10-CM

## 2015-08-27 DIAGNOSIS — E119 Type 2 diabetes mellitus without complications: Secondary | ICD-10-CM | POA: Diagnosis not present

## 2015-08-27 MED ORDER — BETAMETHASONE DIPROPIONATE 0.05 % EX CREA: TOPICAL_CREAM | CUTANEOUS | Status: DC

## 2015-08-27 NOTE — Progress Notes (Signed)
Patient ID: Mario Hartman, male    DOB: 08-06-1923  Age: 82 y.o. MRN: [id number]  Chief Complaint  Patient presents with  . sore on back    x 2 years -  follwo up from care 2 weeks ago  . Wound Check    Subjective:   Patient is here for recheck of the chronic cellulitis and superficial ulcerations of his back. These are doing a little better, but have not healed up completely. He says that he had it on the left side of his back of years back, and it improved after a shot. We do not know what that shot was, but I suspect you may have been a steroid injection. He has had this previously biopsied and it was not cancerous at that time. He still bleeds some. He lives alone and it is difficult for him because he cannot dress his own place on the back. The doxycycline helped. His blood sugar at home was good.  Current allergies, medications, problem list, past/family and social histories reviewed.  Objective:  BP 165/78 mmHg  Pulse 75  Temp(Src) 97.7 F (36.5 C) (Oral)  SpO2 93%  Less erythematous and inflamed looking, but he still has an area about 5 x 7 cm. There are some areas where there is superficial peeling off of the skin with a raw surface underneath it bleeds. No pus. It is not indurated.  Previous wound culture did not grow anything  Procedure note: Explained that we need to do a punch biopsy and patient was in agreement. Anesthetized with 1% lidocaine with epinephrine. 2 punch biopsies using the 3 mm punch were obtained. Specimen sent for pathologic analysis.   Assessment & Plan:   Assessment: 1. Dermatitis   2. Skin ulcer, with unspecified severity ([place])   3. Type 2 diabetes mellitus without complication, without long-term current use of insulin ([place])       Plan: I think we will change and try a little steroid cream on this while the pathology is pending.  No orders of the defined types were placed in this encounter.    Meds ordered this encounter  Medications  .  atorvastatin (LIPITOR) 20 MG tablet    Sig: Take 20 mg by mouth daily.  Marland Kitchen glipiZIDE (GLUCOTROL XL) 2.5 MG 24 hr tablet    Sig: Take 2.5 mg by mouth daily with breakfast.  . cholecalciferol (VITAMIN D) 1000 units tablet    Sig: Take 1,000 Units by mouth daily.  Marland Kitchen oxybutynin (DITROPAN-XL) 5 MG 24 hr tablet    Sig: Take 5 mg by mouth at bedtime.  . traMADol (ULTRAM) 50 MG tablet    Sig: Take by mouth every 6 (six) hours as needed.  Marland Kitchen DISCONTD: tamsulosin (FLOMAX) 0.4 MG CAPS capsule    Sig: Take 0.4 mg by mouth. Reported on 08/27/2015  . cyclobenzaprine (FLEXERIL) 10 MG tablet    Sig: Take 10 mg by mouth at bedtime.  . betamethasone dipropionate (DIPROLENE) 0.05 % cream    Sig: Use once or twice daily on the inflamed area on back    Dispense:  30 g    Refill:  0         Patient Instructions  Wash daily and apply a small amount of the betamethasone cream to the inflamed area. Put a dry dressing on top of that.  Return Tuesday for a recheck by Dr. Linna Darner  Return sooner if problems     Return in about 5 days (around 09/01/2015).  HOPPER,DAVID, MD 08/27/2015

## 2015-08-27 NOTE — Patient Instructions (Signed)
Wash daily and apply a small amount of the betamethasone cream to the inflamed area. Put a dry dressing on top of that.  Return Tuesday for a recheck by Dr. Linna Darner  Return sooner if problems

## 2015-09-01 ENCOUNTER — Ambulatory Visit (INDEPENDENT_AMBULATORY_CARE_PROVIDER_SITE_OTHER): Payer: Medicare Other | Admitting: Family Medicine

## 2015-09-01 VITALS — BP 140/70 | HR 77 | Temp 98.0°F | Resp 18 | Ht 65.0 in | Wt 131.0 lb

## 2015-09-01 DIAGNOSIS — R21 Rash and other nonspecific skin eruption: Secondary | ICD-10-CM | POA: Diagnosis not present

## 2015-09-01 NOTE — Progress Notes (Signed)
Patient ID: Mario Hartman, male    DOB: 1923/09/13  Age: 82 y.o. MRN: [id number]  Chief Complaint  Patient presents with  . Follow-up    Subjective:   Patient is here for a recheck of that place on his back. He is been using the cortisone cream.  Current allergies, medications, problem list, past/family and social histories reviewed.  Objective:  BP 140/70 mmHg  Pulse 77  Temp(Src) 98 F (36.7 C) (Oral)  Resp 18  Ht 5\' 5"  (1.651 m)  Wt 131 lb (59.421 kg)  BMI 21.80 kg/m2  SpO2 94%  Erythema is almost gone except for the 2 biopsy sites which have little ulcers and are very erythematous around them. When I wiped it off it was very friable right around the tube holes.  Assessment & Plan:   Assessment: 1. Rash of back       Plan: Talk to him about how to care for the places. They are not clearing up over the next couple weeks he needs to come back for one more recheck. I will watch the pathology report.      Patient Instructions   Return if not improving. I will be here on the 15th probably.  Try and avoid putting the ointment on the biopsy sites  Once the biopsy sites are healing in, just try and leave the whole area dry with nothing on it for a little while and see how it does  I will let you know the results of pathology when it comes out.   IF you received an x-ray today, you will receive an invoice from [facility]. Please contact [facility] at [phone] with questions or concerns regarding your invoice.   IF you received labwork today, you will receive an invoice from [employer]. Please contact [facility] at [phone] with questions or concerns regarding your invoice.   Our billing staff will not be able to assist you with questions regarding bills from these companies.  You will be contacted with the lab results as soon as they are available. The fastest way to get your results is to activate your My Chart  account. Instructions are located on the last page of this paperwork. If you have not heard from [place] regarding the results in 2 weeks, please contact this office.          No Follow-up on file.   Barb Shear, MD 09/01/2015

## 2015-09-01 NOTE — Patient Instructions (Addendum)
Return if not improving. I will be here on the 15th probably.  Try and avoid putting the ointment on the biopsy sites  Once the biopsy sites are healing in, just try and leave the whole area dry with nothing on it for a little while and see how it does  I will let you know the results of pathology when it comes out.   IF you received an x-ray today, you will receive an invoice from [facility]. Please contact [facility] at [phone] with questions or concerns regarding your invoice.   IF you received labwork today, you will receive an invoice from [employer]. Please contact [facility] at [phone] with questions or concerns regarding your invoice.   Our billing staff will not be able to assist you with questions regarding bills from these companies.  You will be contacted with the lab results as soon as they are available. The fastest way to get your results is to activate your My Chart account. Instructions are located on the last page of this paperwork. If you have not heard from [place] regarding the results in 2 weeks, please contact this office.

## 2015-09-14 ENCOUNTER — Ambulatory Visit (INDEPENDENT_AMBULATORY_CARE_PROVIDER_SITE_OTHER): Payer: Medicare Other | Admitting: Family Medicine

## 2015-09-14 VITALS — BP 112/54 | HR 74 | Temp 97.3°F | Resp 16 | Ht 62.0 in | Wt 134.0 lb

## 2015-09-14 DIAGNOSIS — L98491 Non-pressure chronic ulcer of skin of other sites limited to breakdown of skin: Secondary | ICD-10-CM

## 2015-09-14 NOTE — Progress Notes (Signed)
Patient ID: Mario Hartman, male    DOB: [birth year]  Age: 82 y.o. MRN: [id number]  Chief Complaint  Patient presents with  . Follow-up    rash on back    Subjective:   Patient is here for recheck of his back. The place is improved but not healed completely. It was sticking to the dressing so they have used some Vaseline on it.  Current allergies, medications, problem list, past/family and social histories reviewed.  Objective:  BP 112/54 mmHg  Pulse 74  Temp(Src) 97.3 F (36.3 C) (Oral)  Resp 16  Ht 5\' 2"  (1.575 m)  Wt 134 lb (60.782 kg)  BMI 24.50 kg/m2  SpO2 91%  Pathology report reviewed. No evidence of skin cancer.  The area on the back is no longer erythematous though it is scarred. There are couple of crusted areas that have refused to heal in completely. The knees measure less than 1 cm each.  Assessment & Plan:   Assessment: 1. Chronic skin ulcer, limited to breakdown of skin ([place])       Plan: See instructions. If he calls back that problems have continued to persist, please make him a referral to Dr. Crista Luria, dermatologist.  No orders of the defined types were placed in this encounter.    No orders of the defined types were placed in this encounter.         Patient Instructions   Stop using any ointment or Vaseline on it for 1 week, and use a dry nonstick gauze. At times you can try just leaving your shirt off and leave it open to the air for an hour or so.  If it is not improved over the next week, resume using the betamethasone on it.  If it is still giving problems, we will refer you back to another dermatologist. Call back here if needing the referral.    IF you received an x-ray today, you will receive an invoice from [facility]. Please contact [facility]iology at [phone] with questions or concerns regarding your invoice.   IF you received labwork today, you will receive an invoice from [employer]. Please contact [facility] at [phone] with questions or concerns regarding your invoice.   Our billing staff will not be able to assist you with questions regarding bills from these companies.  You will be contacted with the lab results as soon as they are available. The fastest way to get your results is to activate your My Chart account. Instructions are located on the last page of this paperwork. If you have not heard from [place] regarding the results in 2 weeks, please contact this office.         No Follow-up on file.   Farris Blash, MD 09/14/2015

## 2015-09-14 NOTE — Patient Instructions (Addendum)
Stop using any ointment or Vaseline on it for 1 week, and use a dry nonstick gauze. At times you can try just leaving your shirt off and leave it open to the air for an hour or so.  If it is not improved over the next week, resume using the betamethasone on it.  If it is still giving problems, we will refer you back to another dermatologist (we will request Dr. Crista Luria). Call back here if needing the referral.    IF you received an x-ray today, you will receive an invoice from [facility]. Please contact [facility] Radiology at [phone] with questions or concerns regarding your invoice.   IF you received labwork today, you will receive an invoice from [employer]. Please contact [facility] at [phone] with questions or concerns regarding your invoice.   Our billing staff will not be able to assist you with questions regarding bills from these companies.  You will be contacted with the lab results as soon as they are available. The fastest way to get your results is to activate your My Chart account. Instructions are located on the last page of this paperwork. If you have not heard from [place] regarding the results in 2 weeks, please contact this office.

## 2015-09-15 ENCOUNTER — Telehealth: Payer: Self-pay

## 2015-09-15 NOTE — Telephone Encounter (Signed)
Pt said that Dr. Linna Darner was suppose to prescribe him an antibiotic but it  wasn't at the pharmacy when he got there to pick it up. Please send antibiotic as soon as possible please.

## 2015-09-15 NOTE — Telephone Encounter (Signed)
Left VM for pt to call back asap

## 2015-09-23 ENCOUNTER — Encounter (INDEPENDENT_AMBULATORY_CARE_PROVIDER_SITE_OTHER): Payer: Medicare Other | Admitting: Ophthalmology

## 2015-09-23 DIAGNOSIS — D3131 Benign neoplasm of right choroid: Secondary | ICD-10-CM | POA: Diagnosis not present

## 2015-09-23 DIAGNOSIS — H353231 Exudative age-related macular degeneration, bilateral, with active choroidal neovascularization: Secondary | ICD-10-CM

## 2015-09-23 DIAGNOSIS — H43813 Vitreous degeneration, bilateral: Secondary | ICD-10-CM | POA: Diagnosis not present

## 2015-09-23 DIAGNOSIS — H35033 Hypertensive retinopathy, bilateral: Secondary | ICD-10-CM

## 2015-09-23 DIAGNOSIS — I10 Essential (primary) hypertension: Secondary | ICD-10-CM

## 2015-09-26 ENCOUNTER — Other Ambulatory Visit: Payer: Self-pay | Admitting: Family Medicine

## 2015-11-11 ENCOUNTER — Encounter (INDEPENDENT_AMBULATORY_CARE_PROVIDER_SITE_OTHER): Payer: Medicare Other | Admitting: Ophthalmology

## 2015-11-11 DIAGNOSIS — D3131 Benign neoplasm of right choroid: Secondary | ICD-10-CM | POA: Diagnosis not present

## 2015-11-11 DIAGNOSIS — H353231 Exudative age-related macular degeneration, bilateral, with active choroidal neovascularization: Secondary | ICD-10-CM

## 2015-11-11 DIAGNOSIS — H43813 Vitreous degeneration, bilateral: Secondary | ICD-10-CM | POA: Diagnosis not present

## 2015-11-11 DIAGNOSIS — H35033 Hypertensive retinopathy, bilateral: Secondary | ICD-10-CM

## 2015-11-11 DIAGNOSIS — I10 Essential (primary) hypertension: Secondary | ICD-10-CM | POA: Diagnosis not present

## 2015-12-23 ENCOUNTER — Encounter (INDEPENDENT_AMBULATORY_CARE_PROVIDER_SITE_OTHER): Payer: Medicare Other | Admitting: Ophthalmology

## 2015-12-23 DIAGNOSIS — H35033 Hypertensive retinopathy, bilateral: Secondary | ICD-10-CM | POA: Diagnosis not present

## 2015-12-23 DIAGNOSIS — H43813 Vitreous degeneration, bilateral: Secondary | ICD-10-CM

## 2015-12-23 DIAGNOSIS — H353231 Exudative age-related macular degeneration, bilateral, with active choroidal neovascularization: Secondary | ICD-10-CM

## 2015-12-23 DIAGNOSIS — I10 Essential (primary) hypertension: Secondary | ICD-10-CM | POA: Diagnosis not present

## 2015-12-23 DIAGNOSIS — D3131 Benign neoplasm of right choroid: Secondary | ICD-10-CM | POA: Diagnosis not present

## 2016-01-09 ENCOUNTER — Telehealth: Payer: Self-pay

## 2016-01-09 NOTE — Telephone Encounter (Addendum)
Pt is needing a refill on diprolene pt is also wanting to talk with someone about having a health aide coming in for about a week to help apply this  Cream for his back -the person who usually helps with this always is going out of town  For a week    Best number [phone]

## 2016-01-11 ENCOUNTER — Telehealth: Payer: Self-pay

## 2016-01-11 NOTE — Telephone Encounter (Signed)
Agreed.  Philis Fendt, MS, PA-C 1:08 PM, 01/11/2016

## 2016-01-11 NOTE — Telephone Encounter (Signed)
Patient is calling to request a refill for mupirocin ointment. [phone]  [facility] [address]

## 2016-01-11 NOTE — Telephone Encounter (Signed)
Patient called the third time today.  His back is not any better and he wants his medication for Mupirocin ointment.  He drove 21 miles to come in on Saturday and does not understand what the hold up is.   [phone]

## 2016-01-11 NOTE — Telephone Encounter (Signed)
Patient stated someone told him that his medication was called into pharmacy. Per notes patient was told he need an office visit. Patient stated that wasn't communicated to him. Patient is complaining of pain in his back. Patient stated he need the ointment Dr. Linna Darner prescribed him.

## 2016-01-11 NOTE — Telephone Encounter (Signed)
Detailed message left on voice mail stating pt needs to be seen .Marland Kitchen

## 2016-01-11 NOTE — Telephone Encounter (Signed)
Pt was in office Saturday and someone told him that they could refill his diprolene cream. Was not done pt has not been seen since may. Sent him a meaasage that he needed to be seen for an appointment. He stated that his back was better but now it has flared back up.

## 2016-01-12 ENCOUNTER — Other Ambulatory Visit: Payer: Self-pay | Admitting: Family Medicine

## 2016-01-12 DIAGNOSIS — R21 Rash and other nonspecific skin eruption: Secondary | ICD-10-CM

## 2016-01-12 DIAGNOSIS — L98499 Non-pressure chronic ulcer of skin of other sites with unspecified severity: Secondary | ICD-10-CM

## 2016-01-12 DIAGNOSIS — L309 Dermatitis, unspecified: Secondary | ICD-10-CM

## 2016-01-12 NOTE — Telephone Encounter (Signed)
I am confused - the patient has not be seen since 5/17 -- It looked like with one phone call he wanted betamethasone and this one he wants bactroban both of which work very differently- it would best if the lesion could be seen by a provider - triple antibiotic ointment OTC is close to [place] if he would like to use that

## 2016-01-12 NOTE — Telephone Encounter (Signed)
On 09/14/15 with Dr. Linna Darner  Patient Instructions   Stop using any ointment or Vaseline on it for 1 week, and use a dry nonstick gauze. At times you can try just leaving your shirt off and leave it open to the air for an hour or so.  If it is not improved over the next week, resume using the betamethasone on it.  If it is still giving problems, we will refer you back to another dermatologist. Call back here if needing the referral.    Can we refer or refill medication?

## 2016-01-13 ENCOUNTER — Ambulatory Visit (INDEPENDENT_AMBULATORY_CARE_PROVIDER_SITE_OTHER): Payer: Medicare Other | Admitting: Urgent Care

## 2016-01-13 DIAGNOSIS — L98499 Non-pressure chronic ulcer of skin of other sites with unspecified severity: Secondary | ICD-10-CM

## 2016-01-13 DIAGNOSIS — L309 Dermatitis, unspecified: Secondary | ICD-10-CM

## 2016-01-13 MED ORDER — BETAMETHASONE DIPROPIONATE 0.05 % EX CREA
TOPICAL_CREAM | CUTANEOUS | 0 refills | Status: DC
Start: 2016-01-13 — End: 2016-02-19

## 2016-01-13 NOTE — Progress Notes (Signed)
    MRN: [id number] DOB: 02/26/1924  Subjective:   Mario Hartman is a 81 y.o. male presenting for chief complaint of Medication Refill (beta methasone cream)  Reports 2 year history of intermittent recurrent rash over his back. Patient has been seen here multiple times for the same. States that he has seen several dermatologists. He is requesting a refill of his betamethasone cream as prescribed by Dr. Linna Darner. Denies fever, cough, numbness or tingling. He does admit that the rash is "irritating" and exactly the same as before.   [name] has a current medication list which includes the following prescription(s): atorvastatin, betamethasone dipropionate, cholecalciferol, cyclobenzaprine, glipizide, oxybutynin, and tramadol. Also has No Known Allergies.  [name]  has no past medical history on file. Also  has no past surgical history on file.  Objective:   Vitals: BP 128/80 (BP Location: Right Arm, Patient Position: Sitting, Cuff Size: Normal)   Pulse 85   Temp 98 F (36.7 C) (Oral)   Resp 16   Ht 5\' 5"  (1.651 m)   Wt 133 lb (60.3 kg)   SpO2 98%   BMI 22.13 kg/m   Physical Exam  Constitutional: He is oriented to person, place, and time. He appears well-developed and well-nourished.  Cardiovascular: Normal rate.   Pulmonary/Chest: Effort normal.  Neurological: He is alert and oriented to person, place, and time.  Skin:         Assessment and Plan :   1. Dermatitis 2. Skin ulcer, with unspecified severity ([place]) - I attempted to discuss differential with patient but he was insistent that he only wanted the betamethasone cream. He refused a referral to dermatology but agreed to try the cream and if he has no improvement, he will rtc or call at which point we will pursue referral and review treatment plan.  Jaynee Eagles, PA-C [facility] [phone] 01/13/2016 12:30 PM

## 2016-01-13 NOTE — Telephone Encounter (Signed)
He was seen today

## 2016-01-13 NOTE — Patient Instructions (Addendum)
Use a dry nonstick gauze to wipe your skin. Try just leaving your shirt off and leave it open to the air for an hour or so. Use the steroid cream twice a day for one week.     IF you received an x-ray today, you will receive an invoice from [facility] Radiology. Please contact [facility] Radiology at [phone] with questions or concerns regarding your invoice.   IF you received labwork today, you will receive an invoice from [employer]. Please contact [facility] at [phone] with questions or concerns regarding your invoice.   Our billing staff will not be able to assist you with questions regarding bills from these companies.  You will be contacted with the lab results as soon as they are available. The fastest way to get your results is to activate your My Chart account. Instructions are located on the last page of this paperwork. If you have not heard from [place] regarding the results in 2 weeks, please contact this office.

## 2016-02-03 ENCOUNTER — Encounter (INDEPENDENT_AMBULATORY_CARE_PROVIDER_SITE_OTHER): Payer: Medicare Other | Admitting: Ophthalmology

## 2016-02-03 DIAGNOSIS — H35033 Hypertensive retinopathy, bilateral: Secondary | ICD-10-CM

## 2016-02-03 DIAGNOSIS — H353231 Exudative age-related macular degeneration, bilateral, with active choroidal neovascularization: Secondary | ICD-10-CM

## 2016-02-03 DIAGNOSIS — D3131 Benign neoplasm of right choroid: Secondary | ICD-10-CM | POA: Diagnosis not present

## 2016-02-03 DIAGNOSIS — H43813 Vitreous degeneration, bilateral: Secondary | ICD-10-CM | POA: Diagnosis not present

## 2016-02-03 DIAGNOSIS — I10 Essential (primary) hypertension: Secondary | ICD-10-CM

## 2016-02-17 ENCOUNTER — Telehealth: Payer: Self-pay | Admitting: Urgent Care

## 2016-02-17 ENCOUNTER — Other Ambulatory Visit: Payer: Self-pay

## 2016-02-17 DIAGNOSIS — L309 Dermatitis, unspecified: Secondary | ICD-10-CM

## 2016-02-17 DIAGNOSIS — L98499 Non-pressure chronic ulcer of skin of other sites with unspecified severity: Secondary | ICD-10-CM

## 2016-02-17 NOTE — Telephone Encounter (Signed)
Patient is calling to request a refill for Betamethasone. [phone]  [facility] [address]

## 2016-02-19 NOTE — Telephone Encounter (Signed)
Mario Hartman's plan at 9/13 OV was to see pt back if didn't improve w/ treatment. I called pt to get a report of status of dermatitis and he reported it is a whole lot better. It has cleared up other than 2 little places that "are coming to a head". He only has enough treatment for 1 more day and stated that he needs to get it completely cleared up or it will come right back. Please advise on RF.

## 2016-02-20 ENCOUNTER — Telehealth: Payer: Self-pay | Admitting: Family Medicine

## 2016-02-20 NOTE — Telephone Encounter (Signed)
Patient's daughter calling in to check on rx. Knows that it is last minute, her father does not communicate well. Would like a call when the rx is called in please as she will be picking it up.  [phone]  *pt asked would another provider be able to call in today as Bess Harvest is off?

## 2016-02-20 NOTE — Telephone Encounter (Signed)
Pt calling about refill on Diprolene

## 2016-02-22 MED ORDER — BETAMETHASONE DIPROPIONATE 0.05 % EX CREA: TOPICAL_CREAM | CUTANEOUS | 0 refills | Status: DC

## 2016-02-22 NOTE — Telephone Encounter (Signed)
I will refill the cream but I discussed this issue with the patient at his last visit. I do not believe the cream will help and he was very resistant to any work up. He needs to come back for an office visit. Please let patient know.

## 2016-02-22 NOTE — Telephone Encounter (Signed)
Advised pt of RF and Mani's message. Pt agreed to RTC if it doesn't resolve with this round of medication.

## 2016-02-22 NOTE — Telephone Encounter (Signed)
Lm informing daughter rx sent to pharmacy

## 2016-02-23 ENCOUNTER — Other Ambulatory Visit: Payer: Self-pay

## 2016-02-23 DIAGNOSIS — L309 Dermatitis, unspecified: Secondary | ICD-10-CM

## 2016-02-23 DIAGNOSIS — L98499 Non-pressure chronic ulcer of skin of other sites with unspecified severity: Secondary | ICD-10-CM

## 2016-02-23 MED ORDER — BETAMETHASONE DIPROPIONATE 0.05 % EX CREA: TOPICAL_CREAM | CUTANEOUS | 0 refills | Status: DC

## 2016-03-17 ENCOUNTER — Encounter (INDEPENDENT_AMBULATORY_CARE_PROVIDER_SITE_OTHER): Payer: Medicare Other | Admitting: Ophthalmology

## 2016-03-17 DIAGNOSIS — H353231 Exudative age-related macular degeneration, bilateral, with active choroidal neovascularization: Secondary | ICD-10-CM

## 2016-03-17 DIAGNOSIS — D3131 Benign neoplasm of right choroid: Secondary | ICD-10-CM | POA: Diagnosis not present

## 2016-03-17 DIAGNOSIS — H43813 Vitreous degeneration, bilateral: Secondary | ICD-10-CM

## 2016-03-17 DIAGNOSIS — H35033 Hypertensive retinopathy, bilateral: Secondary | ICD-10-CM

## 2016-03-17 DIAGNOSIS — I10 Essential (primary) hypertension: Secondary | ICD-10-CM | POA: Diagnosis not present

## 2016-04-21 ENCOUNTER — Encounter (INDEPENDENT_AMBULATORY_CARE_PROVIDER_SITE_OTHER): Payer: Medicare Other | Admitting: Ophthalmology

## 2016-04-21 DIAGNOSIS — I10 Essential (primary) hypertension: Secondary | ICD-10-CM

## 2016-04-21 DIAGNOSIS — H43813 Vitreous degeneration, bilateral: Secondary | ICD-10-CM

## 2016-04-21 DIAGNOSIS — H353231 Exudative age-related macular degeneration, bilateral, with active choroidal neovascularization: Secondary | ICD-10-CM | POA: Diagnosis not present

## 2016-04-21 DIAGNOSIS — H35033 Hypertensive retinopathy, bilateral: Secondary | ICD-10-CM | POA: Diagnosis not present

## 2016-04-21 DIAGNOSIS — D3131 Benign neoplasm of right choroid: Secondary | ICD-10-CM

## 2016-06-02 ENCOUNTER — Encounter (INDEPENDENT_AMBULATORY_CARE_PROVIDER_SITE_OTHER): Payer: Medicare Other | Admitting: Ophthalmology

## 2016-06-02 DIAGNOSIS — H353231 Exudative age-related macular degeneration, bilateral, with active choroidal neovascularization: Secondary | ICD-10-CM | POA: Diagnosis not present

## 2016-06-02 DIAGNOSIS — I10 Essential (primary) hypertension: Secondary | ICD-10-CM | POA: Diagnosis not present

## 2016-06-02 DIAGNOSIS — H35033 Hypertensive retinopathy, bilateral: Secondary | ICD-10-CM | POA: Diagnosis not present

## 2016-06-02 DIAGNOSIS — H43813 Vitreous degeneration, bilateral: Secondary | ICD-10-CM | POA: Diagnosis not present

## 2016-06-02 DIAGNOSIS — D3131 Benign neoplasm of right choroid: Secondary | ICD-10-CM

## 2016-06-29 ENCOUNTER — Other Ambulatory Visit: Payer: Self-pay | Admitting: Family Medicine

## 2016-06-29 DIAGNOSIS — R21 Rash and other nonspecific skin eruption: Secondary | ICD-10-CM

## 2016-07-14 ENCOUNTER — Encounter (INDEPENDENT_AMBULATORY_CARE_PROVIDER_SITE_OTHER): Payer: Medicare Other | Admitting: Ophthalmology

## 2016-07-14 DIAGNOSIS — I10 Essential (primary) hypertension: Secondary | ICD-10-CM

## 2016-07-14 DIAGNOSIS — H43813 Vitreous degeneration, bilateral: Secondary | ICD-10-CM | POA: Diagnosis not present

## 2016-07-14 DIAGNOSIS — H353231 Exudative age-related macular degeneration, bilateral, with active choroidal neovascularization: Secondary | ICD-10-CM | POA: Diagnosis not present

## 2016-07-14 DIAGNOSIS — H35033 Hypertensive retinopathy, bilateral: Secondary | ICD-10-CM | POA: Diagnosis not present

## 2016-08-26 ENCOUNTER — Encounter (INDEPENDENT_AMBULATORY_CARE_PROVIDER_SITE_OTHER): Payer: Medicare Other | Admitting: Ophthalmology

## 2016-08-26 DIAGNOSIS — D3131 Benign neoplasm of right choroid: Secondary | ICD-10-CM

## 2016-08-26 DIAGNOSIS — H35033 Hypertensive retinopathy, bilateral: Secondary | ICD-10-CM

## 2016-08-26 DIAGNOSIS — H43813 Vitreous degeneration, bilateral: Secondary | ICD-10-CM

## 2016-08-26 DIAGNOSIS — H353231 Exudative age-related macular degeneration, bilateral, with active choroidal neovascularization: Secondary | ICD-10-CM

## 2016-08-26 DIAGNOSIS — I10 Essential (primary) hypertension: Secondary | ICD-10-CM | POA: Diagnosis not present

## 2016-10-07 ENCOUNTER — Other Ambulatory Visit: Payer: Self-pay | Admitting: Urgent Care

## 2016-10-07 ENCOUNTER — Encounter (INDEPENDENT_AMBULATORY_CARE_PROVIDER_SITE_OTHER): Payer: Medicare Other | Admitting: Ophthalmology

## 2016-10-07 DIAGNOSIS — H35033 Hypertensive retinopathy, bilateral: Secondary | ICD-10-CM

## 2016-10-07 DIAGNOSIS — I10 Essential (primary) hypertension: Secondary | ICD-10-CM | POA: Diagnosis not present

## 2016-10-07 DIAGNOSIS — D3131 Benign neoplasm of right choroid: Secondary | ICD-10-CM

## 2016-10-07 DIAGNOSIS — H353231 Exudative age-related macular degeneration, bilateral, with active choroidal neovascularization: Secondary | ICD-10-CM | POA: Diagnosis not present

## 2016-10-07 DIAGNOSIS — H43813 Vitreous degeneration, bilateral: Secondary | ICD-10-CM

## 2016-10-07 DIAGNOSIS — L98499 Non-pressure chronic ulcer of skin of other sites with unspecified severity: Secondary | ICD-10-CM

## 2016-10-07 DIAGNOSIS — L309 Dermatitis, unspecified: Secondary | ICD-10-CM

## 2016-11-18 ENCOUNTER — Encounter (INDEPENDENT_AMBULATORY_CARE_PROVIDER_SITE_OTHER): Payer: Medicare Other | Admitting: Ophthalmology

## 2016-12-05 ENCOUNTER — Encounter (INDEPENDENT_AMBULATORY_CARE_PROVIDER_SITE_OTHER): Payer: Medicare Other | Admitting: Ophthalmology

## 2016-12-05 DIAGNOSIS — D3131 Benign neoplasm of right choroid: Secondary | ICD-10-CM

## 2016-12-05 DIAGNOSIS — H353231 Exudative age-related macular degeneration, bilateral, with active choroidal neovascularization: Secondary | ICD-10-CM | POA: Diagnosis not present

## 2016-12-05 DIAGNOSIS — H43813 Vitreous degeneration, bilateral: Secondary | ICD-10-CM | POA: Diagnosis not present

## 2016-12-05 DIAGNOSIS — H35033 Hypertensive retinopathy, bilateral: Secondary | ICD-10-CM

## 2016-12-05 DIAGNOSIS — I10 Essential (primary) hypertension: Secondary | ICD-10-CM

## 2017-01-16 ENCOUNTER — Encounter (INDEPENDENT_AMBULATORY_CARE_PROVIDER_SITE_OTHER): Payer: Medicare Other | Admitting: Ophthalmology

## 2017-01-16 DIAGNOSIS — H35033 Hypertensive retinopathy, bilateral: Secondary | ICD-10-CM

## 2017-01-16 DIAGNOSIS — H43813 Vitreous degeneration, bilateral: Secondary | ICD-10-CM | POA: Diagnosis not present

## 2017-01-16 DIAGNOSIS — I10 Essential (primary) hypertension: Secondary | ICD-10-CM

## 2017-01-16 DIAGNOSIS — H353231 Exudative age-related macular degeneration, bilateral, with active choroidal neovascularization: Secondary | ICD-10-CM | POA: Diagnosis not present

## 2017-01-16 DIAGNOSIS — D3131 Benign neoplasm of right choroid: Secondary | ICD-10-CM

## 2017-02-27 ENCOUNTER — Encounter (INDEPENDENT_AMBULATORY_CARE_PROVIDER_SITE_OTHER): Payer: Medicare Other | Admitting: Ophthalmology

## 2017-02-27 DIAGNOSIS — D3131 Benign neoplasm of right choroid: Secondary | ICD-10-CM | POA: Diagnosis not present

## 2017-02-27 DIAGNOSIS — I10 Essential (primary) hypertension: Secondary | ICD-10-CM | POA: Diagnosis not present

## 2017-02-27 DIAGNOSIS — H353231 Exudative age-related macular degeneration, bilateral, with active choroidal neovascularization: Secondary | ICD-10-CM

## 2017-02-27 DIAGNOSIS — H35033 Hypertensive retinopathy, bilateral: Secondary | ICD-10-CM | POA: Diagnosis not present

## 2017-02-27 DIAGNOSIS — H43813 Vitreous degeneration, bilateral: Secondary | ICD-10-CM

## 2017-04-06 ENCOUNTER — Encounter (INDEPENDENT_AMBULATORY_CARE_PROVIDER_SITE_OTHER): Payer: Medicare Other | Admitting: Ophthalmology

## 2017-04-06 DIAGNOSIS — I10 Essential (primary) hypertension: Secondary | ICD-10-CM

## 2017-04-06 DIAGNOSIS — D3131 Benign neoplasm of right choroid: Secondary | ICD-10-CM | POA: Diagnosis not present

## 2017-04-06 DIAGNOSIS — H35033 Hypertensive retinopathy, bilateral: Secondary | ICD-10-CM

## 2017-04-06 DIAGNOSIS — H353231 Exudative age-related macular degeneration, bilateral, with active choroidal neovascularization: Secondary | ICD-10-CM

## 2017-04-06 DIAGNOSIS — H43813 Vitreous degeneration, bilateral: Secondary | ICD-10-CM | POA: Diagnosis not present

## 2017-04-10 ENCOUNTER — Encounter (INDEPENDENT_AMBULATORY_CARE_PROVIDER_SITE_OTHER): Payer: Medicare Other | Admitting: Ophthalmology

## 2017-05-18 ENCOUNTER — Encounter (INDEPENDENT_AMBULATORY_CARE_PROVIDER_SITE_OTHER): Payer: Medicare Other | Admitting: Ophthalmology

## 2017-05-18 DIAGNOSIS — H35033 Hypertensive retinopathy, bilateral: Secondary | ICD-10-CM

## 2017-05-18 DIAGNOSIS — H43813 Vitreous degeneration, bilateral: Secondary | ICD-10-CM

## 2017-05-18 DIAGNOSIS — I10 Essential (primary) hypertension: Secondary | ICD-10-CM

## 2017-05-18 DIAGNOSIS — D3131 Benign neoplasm of right choroid: Secondary | ICD-10-CM | POA: Diagnosis not present

## 2017-05-18 DIAGNOSIS — H353231 Exudative age-related macular degeneration, bilateral, with active choroidal neovascularization: Secondary | ICD-10-CM | POA: Diagnosis not present

## 2017-06-30 ENCOUNTER — Encounter (INDEPENDENT_AMBULATORY_CARE_PROVIDER_SITE_OTHER): Payer: Medicare Other | Admitting: Ophthalmology

## 2017-06-30 DIAGNOSIS — H43813 Vitreous degeneration, bilateral: Secondary | ICD-10-CM | POA: Diagnosis not present

## 2017-06-30 DIAGNOSIS — D3131 Benign neoplasm of right choroid: Secondary | ICD-10-CM | POA: Diagnosis not present

## 2017-06-30 DIAGNOSIS — I10 Essential (primary) hypertension: Secondary | ICD-10-CM | POA: Diagnosis not present

## 2017-06-30 DIAGNOSIS — H353231 Exudative age-related macular degeneration, bilateral, with active choroidal neovascularization: Secondary | ICD-10-CM

## 2017-06-30 DIAGNOSIS — H35033 Hypertensive retinopathy, bilateral: Secondary | ICD-10-CM

## 2017-08-10 ENCOUNTER — Encounter (INDEPENDENT_AMBULATORY_CARE_PROVIDER_SITE_OTHER): Payer: Medicare Other | Admitting: Ophthalmology

## 2017-08-10 DIAGNOSIS — H35033 Hypertensive retinopathy, bilateral: Secondary | ICD-10-CM | POA: Diagnosis not present

## 2017-08-10 DIAGNOSIS — I10 Essential (primary) hypertension: Secondary | ICD-10-CM

## 2017-08-10 DIAGNOSIS — D3131 Benign neoplasm of right choroid: Secondary | ICD-10-CM

## 2017-08-10 DIAGNOSIS — H353231 Exudative age-related macular degeneration, bilateral, with active choroidal neovascularization: Secondary | ICD-10-CM

## 2017-08-10 DIAGNOSIS — H4313 Vitreous hemorrhage, bilateral: Secondary | ICD-10-CM

## 2017-09-21 ENCOUNTER — Encounter (INDEPENDENT_AMBULATORY_CARE_PROVIDER_SITE_OTHER): Payer: Medicare Other | Admitting: Ophthalmology

## 2017-09-21 DIAGNOSIS — H353231 Exudative age-related macular degeneration, bilateral, with active choroidal neovascularization: Secondary | ICD-10-CM

## 2017-09-21 DIAGNOSIS — H43813 Vitreous degeneration, bilateral: Secondary | ICD-10-CM

## 2017-09-21 DIAGNOSIS — I10 Essential (primary) hypertension: Secondary | ICD-10-CM | POA: Diagnosis not present

## 2017-09-21 DIAGNOSIS — D3131 Benign neoplasm of right choroid: Secondary | ICD-10-CM

## 2017-09-21 DIAGNOSIS — H35033 Hypertensive retinopathy, bilateral: Secondary | ICD-10-CM | POA: Diagnosis not present

## 2017-11-01 ENCOUNTER — Encounter (INDEPENDENT_AMBULATORY_CARE_PROVIDER_SITE_OTHER): Payer: Medicare Other | Admitting: Ophthalmology

## 2017-11-01 DIAGNOSIS — D3131 Benign neoplasm of right choroid: Secondary | ICD-10-CM | POA: Diagnosis not present

## 2017-11-01 DIAGNOSIS — H35033 Hypertensive retinopathy, bilateral: Secondary | ICD-10-CM

## 2017-11-01 DIAGNOSIS — H353231 Exudative age-related macular degeneration, bilateral, with active choroidal neovascularization: Secondary | ICD-10-CM

## 2017-11-01 DIAGNOSIS — I10 Essential (primary) hypertension: Secondary | ICD-10-CM

## 2017-11-01 DIAGNOSIS — H43813 Vitreous degeneration, bilateral: Secondary | ICD-10-CM

## 2017-12-13 ENCOUNTER — Encounter (INDEPENDENT_AMBULATORY_CARE_PROVIDER_SITE_OTHER): Payer: Medicare HMO | Admitting: Ophthalmology

## 2017-12-13 DIAGNOSIS — H35033 Hypertensive retinopathy, bilateral: Secondary | ICD-10-CM

## 2017-12-13 DIAGNOSIS — I10 Essential (primary) hypertension: Secondary | ICD-10-CM | POA: Diagnosis not present

## 2017-12-13 DIAGNOSIS — H353231 Exudative age-related macular degeneration, bilateral, with active choroidal neovascularization: Secondary | ICD-10-CM

## 2017-12-13 DIAGNOSIS — D3131 Benign neoplasm of right choroid: Secondary | ICD-10-CM | POA: Diagnosis not present

## 2017-12-13 DIAGNOSIS — H43813 Vitreous degeneration, bilateral: Secondary | ICD-10-CM

## 2017-12-19 ENCOUNTER — Inpatient Hospital Stay (HOSPITAL_COMMUNITY)
Admission: EM | Admit: 2017-12-19 | Discharge: 2017-12-21 | DRG: 190 | Disposition: A | Discharge status: Home Health | Payer: Medicare HMO | Attending: Internal Medicine | Admitting: Internal Medicine

## 2017-12-19 ENCOUNTER — Encounter (HOSPITAL_COMMUNITY): Payer: Self-pay | Admitting: Emergency Medicine

## 2017-12-19 ENCOUNTER — Other Ambulatory Visit: Payer: Self-pay

## 2017-12-19 ENCOUNTER — Emergency Department (HOSPITAL_COMMUNITY): Payer: Medicare HMO

## 2017-12-19 DIAGNOSIS — Z7984 Long term (current) use of oral hypoglycemic drugs: Secondary | ICD-10-CM

## 2017-12-19 DIAGNOSIS — Z7952 Long term (current) use of systemic steroids: Secondary | ICD-10-CM

## 2017-12-19 DIAGNOSIS — Z66 Do not resuscitate: Secondary | ICD-10-CM | POA: Diagnosis present

## 2017-12-19 DIAGNOSIS — D72829 Elevated white blood cell count, unspecified: Secondary | ICD-10-CM | POA: Diagnosis present

## 2017-12-19 DIAGNOSIS — E118 Type 2 diabetes mellitus with unspecified complications: Secondary | ICD-10-CM | POA: Diagnosis not present

## 2017-12-19 DIAGNOSIS — N400 Enlarged prostate without lower urinary tract symptoms: Secondary | ICD-10-CM | POA: Diagnosis present

## 2017-12-19 DIAGNOSIS — M546 Pain in thoracic spine: Secondary | ICD-10-CM | POA: Diagnosis present

## 2017-12-19 DIAGNOSIS — J441 Chronic obstructive pulmonary disease with (acute) exacerbation: Principal | ICD-10-CM | POA: Diagnosis present

## 2017-12-19 DIAGNOSIS — Z79899 Other long term (current) drug therapy: Secondary | ICD-10-CM

## 2017-12-19 DIAGNOSIS — R0602 Shortness of breath: Secondary | ICD-10-CM | POA: Diagnosis present

## 2017-12-19 DIAGNOSIS — Z87891 Personal history of nicotine dependence: Secondary | ICD-10-CM | POA: Diagnosis not present

## 2017-12-19 DIAGNOSIS — E785 Hyperlipidemia, unspecified: Secondary | ICD-10-CM | POA: Diagnosis present

## 2017-12-19 DIAGNOSIS — E1151 Type 2 diabetes mellitus with diabetic peripheral angiopathy without gangrene: Secondary | ICD-10-CM | POA: Diagnosis present

## 2017-12-19 DIAGNOSIS — G8929 Other chronic pain: Secondary | ICD-10-CM | POA: Diagnosis present

## 2017-12-19 DIAGNOSIS — J9601 Acute respiratory failure with hypoxia: Secondary | ICD-10-CM

## 2017-12-19 DIAGNOSIS — E119 Type 2 diabetes mellitus without complications: Secondary | ICD-10-CM

## 2017-12-19 DIAGNOSIS — I10 Essential (primary) hypertension: Secondary | ICD-10-CM | POA: Diagnosis not present

## 2017-12-19 HISTORY — DX: Emphysema, unspecified: J43.9

## 2017-12-19 HISTORY — DX: Peripheral vascular disease, unspecified: I73.9

## 2017-12-19 LAB — CBC WITH DIFFERENTIAL/PLATELET
Basophils Absolute: 0 10*3/uL (ref 0.0–0.1)
Basophils Relative: 0 %
Eosinophils Absolute: 0 10*3/uL (ref 0.0–0.7)
Eosinophils Relative: 0 %
HCT: 41.2 % (ref 39.0–52.0)
Hemoglobin: 13.9 g/dL (ref 13.0–17.0)
Lymphocytes Relative: 6 %
Lymphs Abs: 1.1 10*3/uL (ref 0.7–4.0)
MCH: 33.8 pg (ref 26.0–34.0)
MCHC: 33.7 g/dL (ref 30.0–36.0)
MCV: 100.2 fL — ABNORMAL HIGH (ref 78.0–100.0)
Monocytes Absolute: 1.1 10*3/uL — ABNORMAL HIGH (ref 0.1–1.0)
Monocytes Relative: 6 %
Neutro Abs: 15.1 10*3/uL — ABNORMAL HIGH (ref 1.7–7.7)
Neutrophils Relative %: 88 %
Platelets: 236 10*3/uL (ref 150–400)
RBC: 4.11 MIL/uL — ABNORMAL LOW (ref 4.22–5.81)
RDW: 13.5 % (ref 11.5–15.5)
WBC: 17.3 10*3/uL — ABNORMAL HIGH (ref 4.0–10.5)

## 2017-12-19 LAB — BASIC METABOLIC PANEL
Anion gap: 7 (ref 5–15)
BUN: 19 mg/dL (ref 8–23)
CHLORIDE: 104 mmol/L (ref 98–111)
CO2: 30 mmol/L (ref 22–32)
Calcium: 8.6 mg/dL — ABNORMAL LOW (ref 8.9–10.3)
Creatinine, Ser: 1.01 mg/dL (ref 0.61–1.24)
GFR calc Af Amer: >60 mL/min (ref >60)
GFR calc non Af Amer: >60 mL/min (ref >60)
Glucose, Bld: 146 mg/dL — ABNORMAL HIGH (ref 70–99)
POTASSIUM: 4.6 mmol/L (ref 3.5–5.1)
SODIUM: 141 mmol/L (ref 135–145)

## 2017-12-19 LAB — TROPONIN I: Troponin I: <0.03 ng/mL (ref <0.03)

## 2017-12-19 MED ORDER — METHYLPREDNISOLONE SODIUM SUCC 125 MG IJ SOLR
60.0000 mg | Freq: Once | INTRAMUSCULAR | Status: AC
  Administered 2017-12-19: 60 mg via INTRAVENOUS
  Filled 2017-12-19: qty 2

## 2017-12-19 MED ORDER — ALBUTEROL SULFATE (2.5 MG/3ML) 0.083% IN NEBU
5.0000 mg | INHALATION_SOLUTION | Freq: Once | RESPIRATORY_TRACT | Status: AC
  Administered 2017-12-19: 5 mg via RESPIRATORY_TRACT
  Filled 2017-12-19: qty 6

## 2017-12-19 NOTE — ED Provider Notes (Addendum)
[facility] EMERGENCY DEPARTMENT Provider Note   CSN: [id number] Arrival date & time: 12/19/17  2056     History   Chief Complaint Chief Complaint  Patient presents with  . Shortness of Breath    HPI Mario Hartman is a 84 y.o. male.  Level 5 caveat for acuity of condition.  Patient with a known history of emphysema/COPD presents with worsening dyspnea over the past month, much worse the past 24 hours.  He is not on home oxygen or prednisone.  He uses an albuterol inhaler inhaler, but no nebulizer at home.  He lives independently with the help of his family.  Initial pulse ox was 79%.     Past Medical History:  Diagnosis Date  . Emphysema of lung ([place])   . Peripheral vascular disease [facility])     Patient Active Problem List   Diagnosis Date Noted  . Chronic skin ulcer ([place]) 06/12/2015    Past Surgical History:  Procedure Laterality Date  . CAROTID ENDARTERECTOMY    . VASCULAR SURGERY          Home Medications    Prior to Admission medications   Medication Sig Start Date End Date Taking? Authorizing Provider  atorvastatin (LIPITOR) 20 MG tablet Take 20 mg by mouth daily.    [provider]  BESIVANCE 0.6 % SUSP INSTILL ONE DROP INTO RIGHT EYE 4 TIMES A DAY FOR TWO DAYS AFTER EACH MONTHLY EYE INJECTION 09/21/17   [provider]  betamethasone dipropionate (DIPROLENE) 0.05 % cream Use twice daily on the inflamed area on back. 02/23/16   Jaynee Eagles, PA-C  cholecalciferol (VITAMIN D) 1000 units tablet Take 1,000 Units by mouth daily.    [provider]  cyclobenzaprine (FLEXERIL) 10 MG tablet Take 10 mg by mouth at bedtime.    [provider]  glipiZIDE (GLUCOTROL XL) 2.5 MG 24 hr tablet Take 2.5 mg by mouth daily with breakfast.    [provider]  losartan (COZAAR) 50 MG tablet Take 50 mg by mouth daily. for high blood pressure 12/09/17   [provider]  oxybutynin (DITROPAN-XL) 5 MG 24 hr tablet Take 5 mg by  mouth at bedtime.    [provider]  terazosin (HYTRIN) 10 MG capsule TAKE 1 CAPSULE BY MOUTH NIGHTLY. TO IMPROVE BLADDER FUNCTION. 10/11/17   [provider]  traMADol (ULTRAM) 50 MG tablet Take by mouth every 6 (six) hours as needed.    [provider]    Family History Family History  Problem Relation Age of Onset  . Breast cancer Mother   . Alcoholism Father     Social History Social History   Tobacco Use  . Smoking status: Former Smoker    Last attempt to quit: 08/27/1995    Years since quitting: 22.3  . Smokeless tobacco: Never Used  Substance Use Topics  . Alcohol use: Yes    Alcohol/week: 8.0 standard drinks    Types: 8 Shots of liquor per week  . Drug use: Never     Allergies   Patient has no known allergies.   Review of Systems Review of Systems  Unable to perform ROS: Acuity of condition     Physical Exam Updated Vital Signs BP (!) 150/80   Pulse (!) 55   Resp (!) 21   Ht 5\' 5"  (1.651 m)   Wt 56.7 kg   SpO2 (!) 79%   BMI 20.80 kg/m   Physical Exam  Constitutional: He is oriented to  person, place, and time.  Frail, dyspneic, tachypneic, tachycardic  HENT:  Head: Normocephalic and atraumatic.  Eyes: Conjunctivae are normal.  Neck: Neck supple.  Cardiovascular: Regular rhythm.  Tachycardic  Pulmonary/Chest: He is in respiratory distress. He has decreased breath sounds.  Abdominal: Soft. Bowel sounds are normal.  Musculoskeletal: Normal range of motion.  Neurological: He is alert and oriented to person, place, and time.  Skin: Skin is warm and dry.  Psychiatric: He has a normal mood and affect. His behavior is normal.  Nursing note and vitals reviewed.    ED Treatments / Results  Labs (all labs ordered are listed, but only abnormal results are displayed) Labs Reviewed  CBC WITH DIFFERENTIAL/PLATELET - Abnormal; Notable for the following components:      Result Value   WBC 17.3 (*)    RBC 4.11 (*)    MCV 100.2  (*)    Neutro Abs 15.1 (*)    Monocytes Absolute 1.1 (*)    All other components within normal limits  BASIC METABOLIC PANEL - Abnormal; Notable for the following components:   Glucose, Bld 146 (*)    Calcium 8.6 (*)    All other components within normal limits  TROPONIN I    EKG None  Radiology Dg Chest 2 View  Result Date: 12/19/2017 CLINICAL DATA:  Dyspnea for months EXAM: CHEST - 2 VIEW COMPARISON:  10/25/2007 FINDINGS: Emphysematous hyperinflation of the lungs with subpleural areas of fibrosis at the right lung base. No pulmonary consolidation or overt pulmonary edema. Heart size is normal. There is moderate atherosclerosis at the arch with mild uncoiling of the thoracic aorta. Vascular clips project over the base of the neck on the right. Degenerative changes are present along the included shoulders dorsal spine. Blunting the posterior costophrenic angles may be secondary to hyperinflation. Trace effusions are not entirely excluded. Lower thoracic compression deformity is new since 2009 with approximately 50% anterior height loss. Correlate for pain at this level. This is more likely chronic. IMPRESSION: 1. Emphysematous hyperinflation of the lungs. Mild chronic interstitial change at the lung bases and atelectasis. 2. Moderate aortic atherosclerosis. 3. Likely remote lower thoracic 50% anterior compression deformity. Correlate for pain at this level however as this is a new finding since 2009. Electronically Signed   By: Ashley Royalty M.D.   On: 12/19/2017 22:14    Procedures Procedures (including critical care time)  Medications Ordered in ED Medications  albuterol (PROVENTIL) (2.5 MG/3ML) 0.083% nebulizer solution 5 mg (5 mg Nebulization Given 12/19/17 2129)  methylPREDNISolone sodium succinate (SOLU-MEDROL) 125 mg/2 mL injection 60 mg (60 mg Intravenous Given 12/19/17 2220)     Initial Impression / Assessment and Plan / ED Course  I have reviewed the triage vital signs and the  nursing notes.  Pertinent labs & imaging results that were available during my care of the patient were reviewed by me and considered in my medical decision making (see chart for details).     Patient with a known history of emphysema/COPD presents with worsening respiratory symptoms.  Initial pulse ox 79%.  Chest x-ray shows no obvious pneumonia.  EKG reveals sinus tachycardia.  Troponin negative.  He responded well to IV steroids and nebulizer treatments.  His pulse has gradually reduced.  Admit to general medicine.   CRITICAL CARE Performed by: Nat Christen Total critical care time: 30 minutes Critical care time was exclusive of separately billable procedures and treating other patients. Critical care was necessary to treat or prevent imminent or  life-threatening deterioration. Critical care was time spent personally by me on the following activities: development of treatment plan with patient and/or surrogate as well as nursing, discussions with consultants, evaluation of patient's response to treatment, examination of patient, obtaining history from patient or surrogate, ordering and performing treatments and interventions, ordering and review of laboratory studies, ordering and review of radiographic studies, pulse oximetry and re-evaluation of patient's condition.  Final Clinical Impressions(s) / ED Diagnoses   Final diagnoses:  COPD exacerbation [facility])    ED Discharge Orders    None       Nat Christen, MD 12/19/17 7902    Nat Christen, MD 12/19/17 [phone]

## 2017-12-19 NOTE — H&P (Addendum)
History and Physical    Mario Hartman Mario Hartman DOB: 07/19/1923 DOA: 12/19/2017  PCP: Christain Sacramento, MD   Patient coming from: Home  Chief Complaint: Dyspnea  HPI: Mario Hartman is a 84 y.o. male with medical history significant for emphysema/COPD-does not wear home oxygen, prior tobacco abuse, occasional alcohol use, hypertension, dyslipidemia, diabetes, and BPH who presented to the emergency department with what sounds like worsening shortness of breath over the last several weeks.  He simply came to a point where he could not tolerate his shortness of breath any longer and decided to come to the ED.  He is actually been much worse over the last 24 hours and has had some chest tightness as well as cough productive of yellowish sputum.  He denies any fevers or chills at home and does use an albuterol inhaler which appears to help only minimally.  He has been quite lightheaded and fatigued as well and initial pulse oximetry was 79% on room air when he arrived to the ED. He denies any chest pain or lower extremity edema.   ED Course: Vital signs are stable with elevated pulse rate noted.  Laboratory data with leukocytosis of 17,300 and glucose of 146.  Two-view chest x-ray with noted emphysema and thoracic deformity.  Patient does complain of chronic thoracic pain, but no new trauma noted.  He has received a long nebulizer treatment as well as IV methylprednisolone 125 mg and states that he is already feeling much better.  He continues to remain on 2 L nasal cannula and is only saturating up to 90th percentile at rest.  Review of Systems: All others reviewed and otherwise negative.  Past Medical History:  Diagnosis Date  . Emphysema of lung ([place])   . Peripheral vascular disease [facility])     Past Surgical History:  Procedure Laterality Date  . CAROTID ENDARTERECTOMY    . VASCULAR SURGERY       reports that he quit smoking about 22 years ago. He has never used smokeless tobacco. He  reports that he drinks about 8.0 standard drinks of alcohol per week. He reports that he does not use drugs.  No Known Allergies  Family History  Problem Relation Age of Onset  . Breast cancer Mother   . Alcoholism Father     Prior to Admission medications   Medication Sig Start Date End Date Taking? Authorizing Provider  atorvastatin (LIPITOR) 20 MG tablet Take 20 mg by mouth daily.    [provider]  BESIVANCE 0.6 % SUSP INSTILL ONE DROP INTO RIGHT EYE 4 TIMES A DAY FOR TWO DAYS AFTER EACH MONTHLY EYE INJECTION 09/21/17   [provider]  betamethasone dipropionate (DIPROLENE) 0.05 % cream Use twice daily on the inflamed area on back. 02/23/16   Jaynee Eagles, PA-C  cholecalciferol (VITAMIN D) 1000 units tablet Take 1,000 Units by mouth daily.    [provider]  cyclobenzaprine (FLEXERIL) 10 MG tablet Take 10 mg by mouth at bedtime.    [provider]  glipiZIDE (GLUCOTROL XL) 2.5 MG 24 hr tablet Take 2.5 mg by mouth daily with breakfast.    [provider]  losartan (COZAAR) 50 MG tablet Take 50 mg by mouth daily. for high blood pressure 12/09/17   [provider]  oxybutynin (DITROPAN-XL) 5 MG 24 hr tablet Take 5 mg by mouth at bedtime.    [provider]  terazosin (HYTRIN) 10 MG capsule TAKE 1 CAPSULE BY MOUTH NIGHTLY. TO IMPROVE  BLADDER FUNCTION. 10/11/17   [provider]  traMADol (ULTRAM) 50 MG tablet Take by mouth every 6 (six) hours as needed.    [provider]    Physical Exam: Vitals:   12/19/17 2230 12/19/17 2300 12/19/17 2330 12/20/17 0000  BP: (!) 150/80 (!) 122/49 121/61 (!) 122/54  Pulse: (!) 55 (!) 114 (!) 118   Resp: (!) 21 (!) 22 17 (!) 25  TempSrc:      SpO2: (!) 79% 91% 93%   Weight:      Height:        Constitutional: NAD, calm, comfortable Vitals:   12/19/17 2230 12/19/17 2300 12/19/17 2330 12/20/17 0000  BP: (!) 150/80 (!) 122/49 121/61 (!) 122/54  Pulse: (!) 55 (!) 114  (!) 118   Resp: (!) 21 (!) 22 17 (!) 25  TempSrc:      SpO2: (!) 79% 91% 93%   Weight:      Height:       Eyes: lids and conjunctivae normal ENMT: Mucous membranes are moist.  Neck: normal, supple Respiratory: clear to auscultation bilaterally. Normal respiratory effort. No accessory muscle use. 2L [place]. Cardiovascular: Regular rate and rhythm, no murmurs. No extremity edema. Abdomen: no tenderness, no distention. Bowel sounds positive.  Musculoskeletal:  No joint deformity upper and lower extremities.   Skin: no rashes, lesions, ulcers.  Psychiatric: Normal judgment and insight. Alert and oriented x 3. Normal mood.   Labs on Admission: I have personally reviewed following labs and imaging studies  CBC: Recent Labs  Lab 12/19/17 2120  WBC 17.3*  NEUTROABS 15.1*  HGB 13.9  HCT 41.2  MCV 100.2*  PLT 841   Basic Metabolic Panel: Recent Labs  Lab 12/19/17 2120  NA 141  K 4.6  CL 104  CO2 30  GLUCOSE 146*  BUN 19  CREATININE 1.01  CALCIUM 8.6*   GFR: Estimated Creatinine Clearance: 37.4 mL/min (by C-G formula based on SCr of 1.01 mg/dL). Liver Function Tests: No results for input(s): AST, ALT, ALKPHOS, BILITOT, PROT, ALBUMIN in the last 168 hours. No results for input(s): LIPASE, AMYLASE in the last 168 hours. No results for input(s): AMMONIA in the last 168 hours. Coagulation Profile: No results for input(s): INR, PROTIME in the last 168 hours. Cardiac Enzymes: Recent Labs  Lab 12/19/17 2120  TROPONINI <0.03   BNP (last 3 results) No results for input(s): PROBNP in the last 8760 hours. HbA1C: No results for input(s): HGBA1C in the last 72 hours. CBG: No results for input(s): GLUCAP in the last 168 hours. Lipid Profile: No results for input(s): CHOL, HDL, LDLCALC, TRIG, CHOLHDL, LDLDIRECT in the last 72 hours. Thyroid Function Tests: No results for input(s): TSH, T4TOTAL, FREET4, T3FREE, THYROIDAB in the last 72 hours. Anemia Panel: No results for  input(s): VITAMINB12, FOLATE, FERRITIN, TIBC, IRON, RETICCTPCT in the last 72 hours. Urine analysis: No results found for: COLORURINE, APPEARANCEUR, LABSPEC, PHURINE, GLUCOSEU, HGBUR, BILIRUBINUR, KETONESUR, PROTEINUR, UROBILINOGEN, NITRITE, LEUKOCYTESUR  Radiological Exams on Admission: Dg Chest 2 View  Result Date: 12/19/2017 CLINICAL DATA:  Dyspnea for months EXAM: CHEST - 2 VIEW COMPARISON:  10/25/2007 FINDINGS: Emphysematous hyperinflation of the lungs with subpleural areas of fibrosis at the right lung base. No pulmonary consolidation or overt pulmonary edema. Heart size is normal. There is moderate atherosclerosis at the arch with mild uncoiling of the thoracic aorta. Vascular clips project over the base of the neck on the right. Degenerative changes are present along the included shoulders dorsal spine. Blunting  the posterior costophrenic angles may be secondary to hyperinflation. Trace effusions are not entirely excluded. Lower thoracic compression deformity is new since 2009 with approximately 50% anterior height loss. Correlate for pain at this level. This is more likely chronic. IMPRESSION: 1. Emphysematous hyperinflation of the lungs. Mild chronic interstitial change at the lung bases and atelectasis. 2. Moderate aortic atherosclerosis. 3. Likely remote lower thoracic 50% anterior compression deformity. Correlate for pain at this level however as this is a new finding since 2009. Electronically Signed   By: Ashley Royalty M.D.   On: 12/19/2017 22:14    EKG: Independently reviewed. ST 127bpm. LAFB.  Assessment/Plan Principal Problem:   Acute hypoxemic respiratory failure (HCC) Active Problems:   COPD exacerbation (HCC)   Leukocytosis   Dyslipidemia   Essential hypertension   Diabetes (HCC)   BPH (benign prostatic hyperplasia)    1. Acute hypoxemic respiratory failure secondary to COPD exacerbation.  Maintain on nasal cannula for now; patient has no respiratory distress.  Maintain on  Solu-Medrol 60 mg every 6 hours, along with Xopenex/ipratropium treatments every 6 hours.  Wean oxygen and steroid/breathing treatments as improvement is noted.  Will maintain on oral doxycycline. 2. Occasional alcohol use.  Patient does not appear to be in any withdrawal at this time.  We will continue to monitor and consider CIWA as needed. 3. Hypertension.  Maintain on home losartan. 4. Dyslipidemia.  Maintain on statin. 5. Diabetes.  Maintain on home glipizide as well as carb modified diet and add sliding scale insulin coverage with steroid use. 6. BPH.  Maintain on home medications.   DVT prophylaxis: Lovenox Code Status: DNR Family Communication: Daughter and grandson at bedside Disposition Plan:AECOPD treatment; likely will need home oxygen Consults called:None Admission status: Inpatient, Tele   Clint Strupp Darleen Crocker DO [facility] Pager [phone]  If 7PM-7AM, please contact night-coverage [email or web address] Password [facility]  12/20/2017, 12:31 AM

## 2017-12-19 NOTE — ED Triage Notes (Signed)
Patient reports increasing SOB over months. Became much worse today. O2 sat on RA 82%. Patient states he has used an INH x 1 today.

## 2017-12-20 DIAGNOSIS — I10 Essential (primary) hypertension: Secondary | ICD-10-CM

## 2017-12-20 DIAGNOSIS — J441 Chronic obstructive pulmonary disease with (acute) exacerbation: Principal | ICD-10-CM

## 2017-12-20 DIAGNOSIS — D72829 Elevated white blood cell count, unspecified: Secondary | ICD-10-CM

## 2017-12-20 DIAGNOSIS — E118 Type 2 diabetes mellitus with unspecified complications: Secondary | ICD-10-CM

## 2017-12-20 DIAGNOSIS — E119 Type 2 diabetes mellitus without complications: Secondary | ICD-10-CM

## 2017-12-20 DIAGNOSIS — N400 Enlarged prostate without lower urinary tract symptoms: Secondary | ICD-10-CM

## 2017-12-20 DIAGNOSIS — J9601 Acute respiratory failure with hypoxia: Secondary | ICD-10-CM

## 2017-12-20 DIAGNOSIS — E785 Hyperlipidemia, unspecified: Secondary | ICD-10-CM

## 2017-12-20 LAB — GLUCOSE, CAPILLARY
GLUCOSE-CAPILLARY: 183 mg/dL — AB (ref 70–99)
GLUCOSE-CAPILLARY: 141 mg/dL — AB (ref 70–99)
Glucose-Capillary: 205 mg/dL — ABNORMAL HIGH (ref 70–99)
Glucose-Capillary: 147 mg/dL — ABNORMAL HIGH (ref 70–99)
Glucose-Capillary: 146 mg/dL — ABNORMAL HIGH (ref 70–99)

## 2017-12-20 LAB — CBC
HCT: 34.3 % — ABNORMAL LOW (ref 39.0–52.0)
Hemoglobin: 11.2 g/dL — ABNORMAL LOW (ref 13.0–17.0)
MCH: 33.3 pg (ref 26.0–34.0)
MCHC: 32.7 g/dL (ref 30.0–36.0)
MCV: 102.1 fL — ABNORMAL HIGH (ref 78.0–100.0)
PLATELETS: 207 10*3/uL (ref 150–400)
RBC: 3.36 MIL/uL — AB (ref 4.22–5.81)
RDW: 13.8 % (ref 11.5–15.5)
WBC: 11.9 10*3/uL — AB (ref 4.0–10.5)

## 2017-12-20 LAB — BASIC METABOLIC PANEL
Anion gap: 13 (ref 5–15)
BUN: 25 mg/dL — AB (ref 8–23)
CALCIUM: 8.4 mg/dL — AB (ref 8.9–10.3)
CO2: 25 mmol/L (ref 22–32)
CREATININE: 1.28 mg/dL — AB (ref 0.61–1.24)
Chloride: 103 mmol/L (ref 98–111)
GFR calc non Af Amer: 47 mL/min — ABNORMAL LOW (ref >60)
GFR, EST AFRICAN AMERICAN: 54 mL/min — AB (ref >60)
Glucose, Bld: 270 mg/dL — ABNORMAL HIGH (ref 70–99)
Potassium: 4.7 mmol/L (ref 3.5–5.1)
SODIUM: 141 mmol/L (ref 135–145)

## 2017-12-20 MED ORDER — GLIPIZIDE ER 2.5 MG PO TB24
2.5000 mg | ORAL_TABLET | Freq: Every day | ORAL | Status: DC
  Administered 2017-12-20 – 2017-12-21 (×2): 2.5 mg via ORAL
  Filled 2017-12-20 (×3): qty 1

## 2017-12-20 MED ORDER — GATIFLOXACIN 0.5 % OP SOLN
1.0000 [drp] | Freq: Four times a day (QID) | OPHTHALMIC | Status: DC
  Administered 2017-12-20 – 2017-12-21 (×5): 1 [drp] via OPHTHALMIC
  Filled 2017-12-20: qty 2.5

## 2017-12-20 MED ORDER — LEVALBUTEROL HCL 0.63 MG/3ML IN NEBU
0.6300 mg | INHALATION_SOLUTION | Freq: Four times a day (QID) | RESPIRATORY_TRACT | Status: DC
  Administered 2017-12-20 – 2017-12-21 (×5): 0.63 mg via RESPIRATORY_TRACT
  Filled 2017-12-20 (×5): qty 3

## 2017-12-20 MED ORDER — INSULIN ASPART 100 UNIT/ML ~~LOC~~ SOLN: 0.0000 [IU] | Freq: Every day | SUBCUTANEOUS | Status: DC

## 2017-12-20 MED ORDER — CYCLOBENZAPRINE HCL 10 MG PO TABS
10.0000 mg | ORAL_TABLET | Freq: Every day | ORAL | Status: DC
  Administered 2017-12-20 (×2): 10 mg via ORAL
  Filled 2017-12-20 (×2): qty 1

## 2017-12-20 MED ORDER — ENOXAPARIN SODIUM 40 MG/0.4ML ~~LOC~~ SOLN
40.0000 mg | SUBCUTANEOUS | Status: DC
  Administered 2017-12-20: 40 mg via SUBCUTANEOUS
  Filled 2017-12-20: qty 0.4

## 2017-12-20 MED ORDER — LOSARTAN POTASSIUM 50 MG PO TABS
50.0000 mg | ORAL_TABLET | Freq: Every day | ORAL | Status: DC
  Administered 2017-12-20 – 2017-12-21 (×2): 50 mg via ORAL
  Filled 2017-12-20 (×2): qty 1

## 2017-12-20 MED ORDER — VITAMIN D 1000 UNITS PO TABS
1000.0000 [IU] | ORAL_TABLET | Freq: Every day | ORAL | Status: DC
  Administered 2017-12-20 – 2017-12-21 (×2): 1000 [IU] via ORAL
  Filled 2017-12-20 (×2): qty 1

## 2017-12-20 MED ORDER — OXYBUTYNIN CHLORIDE ER 5 MG PO TB24
5.0000 mg | ORAL_TABLET | Freq: Every day | ORAL | Status: DC
  Administered 2017-12-20 (×2): 5 mg via ORAL
  Filled 2017-12-20 (×2): qty 1

## 2017-12-20 MED ORDER — ONDANSETRON HCL 4 MG PO TABS: 4.0000 mg | ORAL_TABLET | Freq: Four times a day (QID) | ORAL | Status: DC | PRN

## 2017-12-20 MED ORDER — METHYLPREDNISOLONE SODIUM SUCC 125 MG IJ SOLR
60.0000 mg | Freq: Four times a day (QID) | INTRAMUSCULAR | Status: DC
  Administered 2017-12-20 – 2017-12-21 (×6): 60 mg via INTRAVENOUS
  Filled 2017-12-20 (×7): qty 2

## 2017-12-20 MED ORDER — INSULIN ASPART 100 UNIT/ML ~~LOC~~ SOLN
0.0000 [IU] | Freq: Three times a day (TID) | SUBCUTANEOUS | Status: DC
  Administered 2017-12-20 (×2): 1 [IU] via SUBCUTANEOUS
  Administered 2017-12-20: 3 [IU] via SUBCUTANEOUS
  Administered 2017-12-21 (×2): 2 [IU] via SUBCUTANEOUS

## 2017-12-20 MED ORDER — ONDANSETRON HCL 4 MG/2ML IJ SOLN: 4.0000 mg | Freq: Four times a day (QID) | INTRAMUSCULAR | Status: DC | PRN

## 2017-12-20 MED ORDER — TERAZOSIN HCL 5 MG PO CAPS
10.0000 mg | ORAL_CAPSULE | Freq: Every day | ORAL | Status: DC
  Administered 2017-12-20 (×2): 10 mg via ORAL
  Filled 2017-12-20 (×2): qty 2

## 2017-12-20 MED ORDER — TRAMADOL HCL 50 MG PO TABS: 50.0000 mg | ORAL_TABLET | Freq: Four times a day (QID) | ORAL | Status: DC | PRN

## 2017-12-20 MED ORDER — ACETAMINOPHEN 325 MG PO TABS: 650.0000 mg | ORAL_TABLET | Freq: Four times a day (QID) | ORAL | Status: DC | PRN

## 2017-12-20 MED ORDER — ENOXAPARIN SODIUM 30 MG/0.3ML ~~LOC~~ SOLN
30.0000 mg | SUBCUTANEOUS | Status: DC
  Administered 2017-12-21: 30 mg via SUBCUTANEOUS
  Filled 2017-12-20: qty 0.3

## 2017-12-20 MED ORDER — BETAMETHASONE DIPROPIONATE 0.05 % EX CREA
1.0000 "application " | TOPICAL_CREAM | Freq: Two times a day (BID) | CUTANEOUS | Status: DC
  Filled 2017-12-20 (×17): qty 1

## 2017-12-20 MED ORDER — BISACODYL 5 MG PO TBEC: 10.0000 mg | DELAYED_RELEASE_TABLET | Freq: Every day | ORAL | Status: DC | PRN

## 2017-12-20 MED ORDER — SODIUM CHLORIDE 0.9% FLUSH: 3.0000 mL | INTRAVENOUS | Status: DC | PRN

## 2017-12-20 MED ORDER — SODIUM CHLORIDE 0.9 % IV SOLN: 250.0000 mL | INTRAVENOUS | Status: DC | PRN

## 2017-12-20 MED ORDER — SODIUM CHLORIDE 0.9% FLUSH
3.0000 mL | Freq: Two times a day (BID) | INTRAVENOUS | Status: DC
  Administered 2017-12-20 – 2017-12-21 (×4): 3 mL via INTRAVENOUS

## 2017-12-20 MED ORDER — IPRATROPIUM BROMIDE 0.02 % IN SOLN
0.5000 mg | Freq: Four times a day (QID) | RESPIRATORY_TRACT | Status: DC
  Administered 2017-12-20 – 2017-12-21 (×5): 0.5 mg via RESPIRATORY_TRACT
  Filled 2017-12-20 (×5): qty 2.5

## 2017-12-20 MED ORDER — ATORVASTATIN CALCIUM 20 MG PO TABS
20.0000 mg | ORAL_TABLET | Freq: Every day | ORAL | Status: DC
  Administered 2017-12-20 – 2017-12-21 (×2): 20 mg via ORAL
  Filled 2017-12-20 (×2): qty 1

## 2017-12-20 MED ORDER — ACETAMINOPHEN 650 MG RE SUPP: 650.0000 mg | Freq: Four times a day (QID) | RECTAL | Status: DC | PRN

## 2017-12-20 MED ORDER — DOXYCYCLINE HYCLATE 100 MG PO TABS
100.0000 mg | ORAL_TABLET | Freq: Two times a day (BID) | ORAL | Status: DC
  Administered 2017-12-20 – 2017-12-21 (×4): 100 mg via ORAL
  Filled 2017-12-20 (×4): qty 1

## 2017-12-20 MED ORDER — HYDROCORTISONE 1 % EX CREA
TOPICAL_CREAM | Freq: Two times a day (BID) | CUTANEOUS | Status: DC
  Administered 2017-12-20 – 2017-12-21 (×3): via TOPICAL
  Filled 2017-12-20: qty 28

## 2017-12-20 NOTE — Progress Notes (Signed)
PROGRESS NOTE    Mario Hartman  VEH:[id number] DOB: 1923/06/26 DOA: 12/19/2017 PCP: Christain Sacramento, MD    Brief Narrative:  84 year old male with a history of COPD, admitted to the hospital with progressive shortness of breath found to have acute respiratory failure with oxygen saturations of 79% on room air secondary to COPD exacerbation.  Admitted for intravenous steroids, bronchodilators.   Assessment & Plan:   Principal Problem:   Acute hypoxemic respiratory failure (HCC) Active Problems:   COPD exacerbation (HCC)   Leukocytosis   Dyslipidemia   Essential hypertension   Diabetes (HCC)   BPH (benign prostatic hyperplasia)   1. Acute respiratory failure with hypoxia.  Secondary to COPD exacerbation.  Continue to wean off oxygen as tolerated. 2. COPD exacerbation.  Started on intravenous steroids, bronchodilators and antibiotics.  Continue pulmonary hygiene. 3. Occasional alcohol use.  No signs of withdrawal at this time.  Continue to monitor. 4. Hypertension.  Continue on losartan.  Blood pressure stable. 5. Dyslipidemia.  Continue on statin. 6. Diabetes.  Continued on glipizide and started on sliding scale.  Blood sugars stable.   DVT prophylaxis: Lovenox Code Status: DNR Family Communication: Discussed with daughter at bedside Disposition Plan: Discharge home once improved   Consultants:     Procedures:     Antimicrobials:   Doxycycline 8/21 >   Subjective: Feels that his breathing is improving.  He is noted to be hypoxic on room air.  Objective: Vitals:   12/20/17 0611 12/20/17 0734 12/20/17 1506 12/20/17 1700  BP: (!) 94/40   (!) 101/55  Pulse: 91   95  Resp:      Temp: (!) 97.4 F (36.3 C)   (!) 97.5 F (36.4 C)  TempSrc: Oral   Oral  SpO2: 99% 98% 96% 98%  Weight:      Height:        Intake/Output Summary (Last 24 hours) at 12/20/2017 1902 Last data filed at 12/20/2017 1700 Gross per 24 hour  Intake 420 ml  Output -  Net 420 ml    Filed Weights   12/19/17 2111  Weight: 56.7 kg    Examination:  General exam: Appears calm and comfortable  Respiratory system: Bilateral rhonchi. Respiratory effort normal. Cardiovascular system: S1 & S2 heard, RRR. No JVD, murmurs, rubs, gallops or clicks. No pedal edema. Gastrointestinal system: Abdomen is nondistended, soft and nontender. No organomegaly or masses felt. Normal bowel sounds heard. Central nervous system: Alert and oriented. No focal neurological deficits. Extremities: Symmetric 5 x 5 power. Skin: No rashes, lesions or ulcers Psychiatry: Judgement and insight appear normal. Mood & affect appropriate.     Data Reviewed: I have personally reviewed following labs and imaging studies  CBC: Recent Labs  Lab 12/19/17 2120 12/20/17 0524  WBC 17.3* 11.9*  NEUTROABS 15.1*  --   HGB 13.9 11.2*  HCT 41.2 34.3*  MCV 100.2* 102.1*  PLT 236 836   Basic Metabolic Panel: Recent Labs  Lab 12/19/17 2120 12/20/17 0524  NA 141 141  K 4.6 4.7  CL 104 103  CO2 30 25  GLUCOSE 146* 270*  BUN 19 25*  CREATININE 1.01 1.28*  CALCIUM 8.6* 8.4*   GFR: Estimated Creatinine Clearance: 29.5 mL/min (A) (by C-G formula based on SCr of 1.28 mg/dL (H)). Liver Function Tests: No results for input(s): AST, ALT, ALKPHOS, BILITOT, PROT, ALBUMIN in the last 168 hours. No results for input(s): LIPASE, AMYLASE in the last 168 hours. No results for input(s): AMMONIA in  the last 168 hours. Coagulation Profile: No results for input(s): INR, PROTIME in the last 168 hours. Cardiac Enzymes: Recent Labs  Lab 12/19/17 2120  TROPONINI <0.03   BNP (last 3 results) No results for input(s): PROBNP in the last 8760 hours. HbA1C: No results for input(s): HGBA1C in the last 72 hours. CBG: Recent Labs  Lab 12/20/17 0102 12/20/17 0740 12/20/17 1138 12/20/17 1646  GLUCAP 183* 205* 147* 141*   Lipid Profile: No results for input(s): CHOL, HDL, LDLCALC, TRIG, CHOLHDL, LDLDIRECT in  the last 72 hours. Thyroid Function Tests: No results for input(s): TSH, T4TOTAL, FREET4, T3FREE, THYROIDAB in the last 72 hours. Anemia Panel: No results for input(s): VITAMINB12, FOLATE, FERRITIN, TIBC, IRON, RETICCTPCT in the last 72 hours. Sepsis Labs: No results for input(s): PROCALCITON, LATICACIDVEN in the last 168 hours.  No results found for this or any previous visit (from the past 240 hour(s)).       Radiology Studies: Dg Chest 2 View  Result Date: 12/19/2017 CLINICAL DATA:  Dyspnea for months EXAM: CHEST - 2 VIEW COMPARISON:  10/25/2007 FINDINGS: Emphysematous hyperinflation of the lungs with subpleural areas of fibrosis at the right lung base. No pulmonary consolidation or overt pulmonary edema. Heart size is normal. There is moderate atherosclerosis at the arch with mild uncoiling of the thoracic aorta. Vascular clips project over the base of the neck on the right. Degenerative changes are present along the included shoulders dorsal spine. Blunting the posterior costophrenic angles may be secondary to hyperinflation. Trace effusions are not entirely excluded. Lower thoracic compression deformity is new since 2009 with approximately 50% anterior height loss. Correlate for pain at this level. This is more likely chronic. IMPRESSION: 1. Emphysematous hyperinflation of the lungs. Mild chronic interstitial change at the lung bases and atelectasis. 2. Moderate aortic atherosclerosis. 3. Likely remote lower thoracic 50% anterior compression deformity. Correlate for pain at this level however as this is a new finding since 2009. Electronically Signed   By: Ashley Royalty M.D.   On: 12/19/2017 22:14        Scheduled Meds: . atorvastatin  20 mg Oral Daily  . cholecalciferol  1,000 Units Oral Daily  . cyclobenzaprine  10 mg Oral QHS  . doxycycline  100 mg Oral Q12H  . [START ON 12/21/2017] enoxaparin (LOVENOX) injection  30 mg Subcutaneous Q24H  . gatifloxacin  1 drop Right Eye QID  .  glipiZIDE  2.5 mg Oral Q breakfast  . hydrocortisone cream   Topical BID  . insulin aspart  0-5 Units Subcutaneous QHS  . insulin aspart  0-9 Units Subcutaneous TID WC  . ipratropium  0.5 mg Nebulization Q6H  . levalbuterol  0.63 mg Nebulization Q6H  . losartan  50 mg Oral Daily  . methylPREDNISolone (SOLU-MEDROL) injection  60 mg Intravenous Q6H  . oxybutynin  5 mg Oral QHS  . sodium chloride flush  3 mL Intravenous Q12H  . terazosin  10 mg Oral QHS   Continuous Infusions: . sodium chloride       LOS: 1 day    Time spent: 37mins    Kathie Dike, MD [facility] Pager [phone]  If 7PM-7AM, please contact night-coverage [email or web address] Password [facility] 12/20/2017, 7:02 PM

## 2017-12-20 NOTE — Care Management Note (Signed)
Case Management Note  Patient Details  Name: Mario Hartman MRN: [id number] Date of Birth: 1923/12/21  Subjective/Objective:      Admitted with COPD. From home, lives alone. When asked about support pt says he has 2 or 3 friends who come to check on him. He also mentions raising 3 grandsons who make sure he gets what he needs. He uses a can with ambulation. He drives. He prepares frozen meals or eats out. He has an inhaler to uses as needed but does not have a neb machine or home oxygen. Currently on supplemental O2.               Action/Plan: Anticipate pt may benefit from [place], home O2 and/or neb machine at DC. CM will follow for needs.   Expected Discharge Date:  12/22/17               Expected Discharge Plan:  [place]  In-House Referral:  NA  Discharge planning Services  CM Consult  Status of Service:  In process, will continue to follow  Sherald Barge, RN 12/20/2017, 1:21 PM

## 2017-12-20 NOTE — Progress Notes (Signed)
Inpatient Diabetes Program Recommendations  AACE/ADA: New Consensus Statement on Inpatient Glycemic Control (2015)  Target Ranges:  Prepandial:   less than 140 mg/dL      Peak postprandial:   less than 180 mg/dL (1-2 hours)      Critically ill patients:  140 - 180 mg/dL   Lab Results  Component Value Date   GLUCAP 205 (H) 12/20/2017   HGBA1C  10/24/2007    5.6 (NOTE)   The ADA recommends the following therapeutic goal for glycemic   control related to Hgb A1C measurement:   Goal of Therapy:   < 7.0% Hgb A1C   Reference: American Diabetes Association: Clinical Practice   Recommendations 2008, Diabetes Care,  2008, 31:(Suppl 1).    Review of Glycemic Control  Diabetes history: DM2 Outpatient Diabetes medications: Glucotrol 2.5 mg qd Current orders for Inpatient glycemic control: Glucotrol 2.5 mg + Novolog sensitive correction tid + hs 0=5 units  Inpatient Diabetes Program Recommendations:   D/C oral DM medications while in the hospital Add Levemir 6 units daily (0.1 unit/kg x 56.7 kg  Thank you, Bethena Roys E. Jahzeel Poythress, RN, MSN, CDE  Diabetes Coordinator Inpatient Glycemic Control Team Team Pager [phone] (8am-5pm) 12/20/2017 7:58 AM

## 2017-12-21 LAB — GLUCOSE, CAPILLARY
GLUCOSE-CAPILLARY: 151 mg/dL — AB (ref 70–99)
GLUCOSE-CAPILLARY: 160 mg/dL — AB (ref 70–99)

## 2017-12-21 MED ORDER — DOXYCYCLINE HYCLATE 100 MG PO TABS: 100.0000 mg | ORAL_TABLET | Freq: Two times a day (BID) | ORAL | 0 refills | Status: AC

## 2017-12-21 MED ORDER — LEVALBUTEROL HCL 0.63 MG/3ML IN NEBU: 0.6300 mg | INHALATION_SOLUTION | Freq: Four times a day (QID) | RESPIRATORY_TRACT | Status: DC | PRN

## 2017-12-21 MED ORDER — ALBUTEROL SULFATE (2.5 MG/3ML) 0.083% IN NEBU: 2.5000 mg | INHALATION_SOLUTION | Freq: Four times a day (QID) | RESPIRATORY_TRACT | 12 refills | Status: AC | PRN

## 2017-12-21 MED ORDER — PREDNISONE 10 MG PO TABS: ORAL_TABLET | ORAL | 0 refills | Status: AC

## 2017-12-21 MED ORDER — ALBUTEROL SULFATE HFA 108 (90 BASE) MCG/ACT IN AERS: 2.0000 | INHALATION_SPRAY | Freq: Four times a day (QID) | RESPIRATORY_TRACT | 2 refills | Status: AC | PRN

## 2017-12-21 MED ORDER — LEVALBUTEROL HCL 0.63 MG/3ML IN NEBU
0.6300 mg | INHALATION_SOLUTION | Freq: Three times a day (TID) | RESPIRATORY_TRACT | Status: DC
  Administered 2017-12-21: 0.63 mg via RESPIRATORY_TRACT
  Filled 2017-12-21: qty 3

## 2017-12-21 MED ORDER — IPRATROPIUM BROMIDE 0.02 % IN SOLN
0.5000 mg | Freq: Three times a day (TID) | RESPIRATORY_TRACT | Status: DC
  Administered 2017-12-21: 0.5 mg via RESPIRATORY_TRACT
  Filled 2017-12-21: qty 2.5

## 2017-12-21 MED ORDER — TIOTROPIUM BROMIDE MONOHYDRATE 18 MCG IN CAPS: 18.0000 ug | ORAL_CAPSULE | Freq: Every day | RESPIRATORY_TRACT | 2 refills | Status: AC

## 2017-12-21 MED ORDER — GUAIFENESIN ER 600 MG PO TB12: 600.0000 mg | ORAL_TABLET | Freq: Two times a day (BID) | ORAL | 2 refills | Status: AC

## 2017-12-21 NOTE — Plan of Care (Signed)
Discharge instructions reviewed with patient and patient's daughter.  Medical equipment explained and use demonstrated to patient and patient's daughter.  Both verbalized/demonstrated understanding of discharge instructions and equipment use.  Patient discharged home with daughter.

## 2017-12-21 NOTE — Discharge Summary (Signed)
Physician Discharge Summary  Mario Hartman:[id number] DOB: Mar 22, 1924 DOA: 12/19/2017  PCP: Christain Sacramento, MD  Admit date: 12/19/2017 Discharge date: 12/21/2017  Admitted From: home Disposition:  home  Recommendations for Outpatient Follow-up:  1. Follow up with PCP in 1-2 weeks 2. Please obtain BMP/CBC in one week  Home Health: Home health RN, PT Equipment/Devices: Oxygen at 2 L, nebulizer machine  Discharge Condition: Stable CODE STATUS: DNR Diet recommendation: Heart healthy  Brief/Interim Summary: 84 year old male with a history of COPD, admitted to the hospital with progressive shortness of breath found to have acute respiratory failure with oxygen saturations of 79% on room air secondary to COPD exacerbation.  Admitted for intravenous steroids, bronchodilators.  Discharge Diagnoses:  Principal Problem:   Acute hypoxemic respiratory failure (HCC) Active Problems:   COPD exacerbation (HCC)   Leukocytosis   Dyslipidemia   Essential hypertension   Diabetes (HCC)   BPH (benign prostatic hyperplasia)  1. Acute respiratory failure with hypoxia.  Secondary to COPD exacerbation.  Although the patient has improved, wheezing has resolved and respiratory effort has normalized, he still hypoxic requiring supplemental oxygen.  Anticipate that he will need chronic oxygen supplementation.  On ambulation and at rest oxygen saturations are in the mid 80s.  This improved to low 90s with 2 L of oxygen.  Will discharge home on the same. 2. COPD exacerbation.  Treated with intravenous steroids, bronchodilators and antibiotics.  Overall wheezing has resolved and respiratory status is stabilizing.  Transition to prednisone taper.  Continue bronchodilators at home. 3. Diabetes.  Continue on glipizide on discharge. 4. Hypertension.  Blood pressures are running in the high 90s to low 100s.  Will discontinue losartan for now. 5. Dyslipidemia.  Continue on statin  Discharge  Instructions  Discharge Instructions    Diet - low sodium heart healthy   Complete by:  As directed    Increase activity slowly   Complete by:  As directed      Allergies as of 12/21/2017   No Known Allergies     Medication List    STOP taking these medications   losartan 50 MG tablet Commonly known as:  COZAAR     TAKE these medications   albuterol (2.5 MG/3ML) 0.083% nebulizer solution Commonly known as:  PROVENTIL Take 3 mLs (2.5 mg total) by nebulization every 6 (six) hours as needed for wheezing or shortness of breath.   albuterol 108 (90 Base) MCG/ACT inhaler Commonly known as:  PROVENTIL HFA;VENTOLIN HFA Inhale 2 puffs into the lungs every 6 (six) hours as needed for wheezing or shortness of breath.   atorvastatin 20 MG tablet Commonly known as:  LIPITOR Take 10 mg by mouth daily.   BESIVANCE 0.6 % Susp Generic drug:  Besifloxacin HCl Place 1 drop into the right eye See admin instructions. Instill one drop into right eye 4 times daily for 2 days following each monthly eye injection as directed.   cholecalciferol 1000 units tablet Commonly known as:  VITAMIN D Take 1,000 Units by mouth daily.   cyclobenzaprine 10 MG tablet Commonly known as:  FLEXERIL Take 10 mg by mouth at bedtime.   doxycycline 100 MG tablet Commonly known as:  VIBRA-TABS Take 1 tablet (100 mg total) by mouth every 12 (twelve) hours.   glipiZIDE 2.5 MG 24 hr tablet Commonly known as:  GLUCOTROL XL Take 1.25 mg by mouth daily with breakfast.   guaiFENesin 600 MG 12 hr tablet Commonly known as:  MUCINEX Take 1 tablet (600  mg total) by mouth 2 (two) times daily.   predniSONE 10 MG tablet Commonly known as:  DELTASONE Take 40mg  po daily for 2 days then 30mg  daily for 2 days then 20mg  daily for 2 days then 10mg  daily for 2 days then stop   terazosin 10 MG capsule Commonly known as:  HYTRIN Take 10 mg by mouth at bedtime.   tiotropium 18 MCG inhalation capsule Commonly known as:   SPIRIVA Place 1 capsule (18 mcg total) into inhaler and inhale daily.   traMADol 50 MG tablet Commonly known as:  ULTRAM Take by mouth every 6 (six) hours as needed.            Durable Medical Equipment  (From admission, onward)         Start     Ordered   12/21/17 1524  For home use only DME oxygen  Once    Question Answer Comment  Mode or (Route) Nasal cannula   Liters per Minute 2   Frequency Continuous (stationary and portable oxygen unit needed)   Oxygen conserving device Yes   Oxygen delivery system Gas      12/21/17 1524   12/21/17 1447  For home use only DME Nebulizer machine  Once    Question:  Patient needs a nebulizer to treat with the following condition  Answer:  COPD (chronic obstructive pulmonary disease) ([place])   12/21/17 1447         Follow-up Information    [facility].   Specialty:  Home Health Services Contact information: [address] [place] [address] [phone]          No Known Allergies  Consultations:     Procedures/Studies: Dg Chest 2 View  Result Date: 12/19/2017 CLINICAL DATA:  Dyspnea for months EXAM: CHEST - 2 VIEW COMPARISON:  10/25/2007 FINDINGS: Emphysematous hyperinflation of the lungs with subpleural areas of fibrosis at the right lung base. No pulmonary consolidation or overt pulmonary edema. Heart size is normal. There is moderate atherosclerosis at the arch with mild uncoiling of the thoracic aorta. Vascular clips project over the base of the neck on the right. Degenerative changes are present along the included shoulders dorsal spine. Blunting the posterior costophrenic angles may be secondary to hyperinflation. Trace effusions are not entirely excluded. Lower thoracic compression deformity is new since 2009 with approximately 50% anterior height loss. Correlate for pain at this level. This is more likely chronic. IMPRESSION: 1. Emphysematous hyperinflation of the lungs. Mild  chronic interstitial change at the lung bases and atelectasis. 2. Moderate aortic atherosclerosis. 3. Likely remote lower thoracic 50% anterior compression deformity. Correlate for pain at this level however as this is a new finding since 2009. Electronically Signed   By: Ashley Royalty M.D.   On: 12/19/2017 22:14       Subjective: He does have some cough, overall feels that wheezing is better with bronchodilators and steroids.  Discharge Exam: Vitals:   12/21/17 1511 12/21/17 1512  BP: (!) 91/38 (!) 99/46  Pulse: 70   Resp: 18   Temp: 98.8 F (37.1 C)   SpO2: 94%    Vitals:   12/21/17 0740 12/21/17 1421 12/21/17 1511 12/21/17 1512  BP:   (!) 91/38 (!) 99/46  Pulse:   70   Resp:   18   Temp:   98.8 F (37.1 C)   TempSrc:      SpO2: 94% 96% 94%   Weight:  Height:        General: Pt is alert, awake, not in acute distress Cardiovascular: RRR, S1/S2 +, no rubs, no gallops Respiratory: CTA bilaterally, no wheezing, no rhonchi Abdominal: Soft, NT, ND, bowel sounds + Extremities: no edema, no cyanosis    The results of significant diagnostics from this hospitalization (including imaging, microbiology, ancillary and laboratory) are listed below for reference.     Microbiology: No results found for this or any previous visit (from the past 240 hour(s)).   Labs: BNP (last 3 results) No results for input(s): BNP in the last 8760 hours. Basic Metabolic Panel: Recent Labs  Lab 12/19/17 2120 12/20/17 0524  NA 141 141  K 4.6 4.7  CL 104 103  CO2 30 25  GLUCOSE 146* 270*  BUN 19 25*  CREATININE 1.01 1.28*  CALCIUM 8.6* 8.4*   Liver Function Tests: No results for input(s): AST, ALT, ALKPHOS, BILITOT, PROT, ALBUMIN in the last 168 hours. No results for input(s): LIPASE, AMYLASE in the last 168 hours. No results for input(s): AMMONIA in the last 168 hours. CBC: Recent Labs  Lab 12/19/17 2120 12/20/17 0524  WBC 17.3* 11.9*  NEUTROABS 15.1*  --   HGB 13.9 11.2*   HCT 41.2 34.3*  MCV 100.2* 102.1*  PLT 236 207   Cardiac Enzymes: Recent Labs  Lab 12/19/17 2120  TROPONINI <0.03   BNP: Invalid input(s): POCBNP CBG: Recent Labs  Lab 12/20/17 1138 12/20/17 1646 12/20/17 2217 12/21/17 0748 12/21/17 1139  GLUCAP 147* 141* 146* 151* 160*   D-Dimer No results for input(s): DDIMER in the last 72 hours. Hgb A1c No results for input(s): HGBA1C in the last 72 hours. Lipid Profile No results for input(s): CHOL, HDL, LDLCALC, TRIG, CHOLHDL, LDLDIRECT in the last 72 hours. Thyroid function studies No results for input(s): TSH, T4TOTAL, T3FREE, THYROIDAB in the last 72 hours.  Invalid input(s): FREET3 Anemia work up No results for input(s): VITAMINB12, FOLATE, FERRITIN, TIBC, IRON, RETICCTPCT in the last 72 hours. Urinalysis No results found for: COLORURINE, APPEARANCEUR, LABSPEC, [place], GLUCOSEU, HGBUR, BILIRUBINUR, KETONESUR, PROTEINUR, UROBILINOGEN, NITRITE, LEUKOCYTESUR Sepsis Labs Invalid input(s): PROCALCITONIN,  WBC,  LACTICIDVEN Microbiology No results found for this or any previous visit (from the past 240 hour(s)).   Time coordinating discharge: 81mins  SIGNED:   Kathie Dike, MD  [facility] 12/21/2017, 3:41 PM Pager   If 7PM-7AM, please contact night-coverage [email or web address] Password [email or web address]

## 2017-12-21 NOTE — Care Management Note (Signed)
Case Management Note  Patient Details  Name: Mario Hartman MRN: [id number] Date of Birth: [birth year]  Action/Plan: DC home today. Pt says he will have daughter and nephew for assistance. Pt will need home oxygen, neb machine. Pt will get referral for [facility] nurse and PT also. Pt has no preference of provider and was given list of provider options, okay with [facility]. Vaughan Basta, [facility] rep, given referral. Will deliver DME to pt room prior to DC. CM called patient daughter, Charlett Nose, no answer, left HIPPA compliant message.   Expected Discharge Date:  12/22/17               Expected Discharge Plan:  [place]  In-House Referral:  NA  Discharge planning Services  CM Consult  Post Acute Care Choice:  Durable Medical Equipment Choice offered to:  Patient  DME Arranged:  Oxygen, Nebulizer machine DME Agency:  [place] Arranged:  RN, PT [facility] Agency:  [place]  Status of Service:  Completed, signed off  If discussed at [place] of Stay Meetings, dates discussed:    Additional Comments:  Sherald Barge, RN 12/21/2017, 1:45 PM

## 2017-12-21 NOTE — Progress Notes (Signed)
SATURATION QUALIFICATIONS: (This note is used to comply with regulatory documentation for home oxygen)  Patient Saturations on Room Air at Rest = 85%  Patient Saturations on Room Air while Ambulating = 83%  Patient Saturations on 2L liters of oxygen while Ambulating = 93%  Please briefly explain why patient needs home oxygen:Oxygen Therapy   Treatment Indications:     Treatment Goals:     Plan of Care:

## 2018-01-24 ENCOUNTER — Encounter (INDEPENDENT_AMBULATORY_CARE_PROVIDER_SITE_OTHER): Payer: Medicare HMO | Admitting: Ophthalmology

## 2018-01-24 DIAGNOSIS — H353231 Exudative age-related macular degeneration, bilateral, with active choroidal neovascularization: Secondary | ICD-10-CM | POA: Diagnosis not present

## 2018-01-24 DIAGNOSIS — I10 Essential (primary) hypertension: Secondary | ICD-10-CM

## 2018-01-24 DIAGNOSIS — D3131 Benign neoplasm of right choroid: Secondary | ICD-10-CM

## 2018-01-24 DIAGNOSIS — H35033 Hypertensive retinopathy, bilateral: Secondary | ICD-10-CM | POA: Diagnosis not present

## 2018-01-24 DIAGNOSIS — H43813 Vitreous degeneration, bilateral: Secondary | ICD-10-CM | POA: Diagnosis not present

## 2018-03-07 ENCOUNTER — Encounter (INDEPENDENT_AMBULATORY_CARE_PROVIDER_SITE_OTHER): Payer: Medicare HMO | Admitting: Ophthalmology

## 2018-03-07 DIAGNOSIS — H43813 Vitreous degeneration, bilateral: Secondary | ICD-10-CM | POA: Diagnosis not present

## 2018-03-07 DIAGNOSIS — H35033 Hypertensive retinopathy, bilateral: Secondary | ICD-10-CM | POA: Diagnosis not present

## 2018-03-07 DIAGNOSIS — I10 Essential (primary) hypertension: Secondary | ICD-10-CM | POA: Diagnosis not present

## 2018-03-07 DIAGNOSIS — D3131 Benign neoplasm of right choroid: Secondary | ICD-10-CM

## 2018-03-07 DIAGNOSIS — H353231 Exudative age-related macular degeneration, bilateral, with active choroidal neovascularization: Secondary | ICD-10-CM | POA: Diagnosis not present

## 2018-04-06 ENCOUNTER — Encounter (HOSPITAL_COMMUNITY): Payer: Self-pay | Admitting: Emergency Medicine

## 2018-04-06 ENCOUNTER — Emergency Department (HOSPITAL_COMMUNITY)
Admission: EM | Admit: 2018-04-06 | Discharge: 2018-05-02 | Disposition: E | Payer: Medicare HMO | Attending: Emergency Medicine | Admitting: Emergency Medicine

## 2018-04-06 ENCOUNTER — Emergency Department (HOSPITAL_COMMUNITY): Payer: Medicare HMO

## 2018-04-06 ENCOUNTER — Other Ambulatory Visit: Payer: Self-pay

## 2018-04-06 DIAGNOSIS — Z7984 Long term (current) use of oral hypoglycemic drugs: Secondary | ICD-10-CM | POA: Insufficient documentation

## 2018-04-06 DIAGNOSIS — G9340 Encephalopathy, unspecified: Secondary | ICD-10-CM

## 2018-04-06 DIAGNOSIS — E119 Type 2 diabetes mellitus without complications: Secondary | ICD-10-CM | POA: Insufficient documentation

## 2018-04-06 DIAGNOSIS — J449 Chronic obstructive pulmonary disease, unspecified: Secondary | ICD-10-CM | POA: Insufficient documentation

## 2018-04-06 DIAGNOSIS — J939 Pneumothorax, unspecified: Secondary | ICD-10-CM | POA: Diagnosis not present

## 2018-04-06 DIAGNOSIS — I469 Cardiac arrest, cause unspecified: Secondary | ICD-10-CM | POA: Diagnosis not present

## 2018-04-06 DIAGNOSIS — Z87891 Personal history of nicotine dependence: Secondary | ICD-10-CM | POA: Diagnosis not present

## 2018-04-06 DIAGNOSIS — Z515 Encounter for palliative care: Secondary | ICD-10-CM

## 2018-04-06 DIAGNOSIS — I10 Essential (primary) hypertension: Secondary | ICD-10-CM | POA: Insufficient documentation

## 2018-04-06 DIAGNOSIS — Z79899 Other long term (current) drug therapy: Secondary | ICD-10-CM | POA: Insufficient documentation

## 2018-04-06 LAB — I-STAT ARTERIAL BLOOD GAS, ED
ACID-BASE DEFICIT: 14 mmol/L — AB (ref 0.0–2.0)
Bicarbonate: 13.6 mmol/L — ABNORMAL LOW (ref 20.0–28.0)
O2 Saturation: 82 %
PO2 ART: 50 mmHg — AB (ref 83.0–108.0)
Patient temperature: 94
TCO2: 15 mmol/L — ABNORMAL LOW (ref 22–32)
pCO2 arterial: 36.1 mmHg (ref 32.0–48.0)
pH, Arterial: 7.169 — CL (ref 7.350–7.450)

## 2018-04-06 LAB — CBC WITH DIFFERENTIAL/PLATELET
BAND NEUTROPHILS: 12 %
BASOS ABS: 0 10*3/uL (ref 0.0–0.1)
Basophils Relative: 0 %
Blasts: 0 %
EOS ABS: 0 10*3/uL (ref 0.0–0.5)
EOS PCT: 0 %
HEMATOCRIT: 45.8 % (ref 39.0–52.0)
HEMOGLOBIN: 13.3 g/dL (ref 13.0–17.0)
LYMPHS ABS: 3.8 10*3/uL (ref 0.7–4.0)
LYMPHS PCT: 25 %
MCH: 30.4 pg (ref 26.0–34.0)
MCHC: 29 g/dL — AB (ref 30.0–36.0)
MCV: 104.8 fL — ABNORMAL HIGH (ref 80.0–100.0)
METAMYELOCYTES PCT: 0 %
MONOS PCT: 4 %
Monocytes Absolute: 0.6 10*3/uL (ref 0.1–1.0)
Myelocytes: 1 %
NEUTROS ABS: 10.7 10*3/uL — AB (ref 1.7–7.7)
Neutrophils Relative %: 58 %
OTHER: 0 %
Platelets: 208 10*3/uL (ref 150–400)
Promyelocytes Relative: 0 %
RBC: 4.37 MIL/uL (ref 4.22–5.81)
RDW: 14.4 % (ref 11.5–15.5)
Smear Review: ADEQUATE
WBC: 15.1 10*3/uL — AB (ref 4.0–10.5)
nRBC: 0 /100 WBC
nRBC: 0.5 % — ABNORMAL HIGH (ref 0.0–0.2)

## 2018-04-06 LAB — BASIC METABOLIC PANEL
Anion gap: 27 — ABNORMAL HIGH (ref 5–15)
BUN: 112 mg/dL — AB (ref 8–23)
CO2: 12 mmol/L — AB (ref 22–32)
CREATININE: 4.45 mg/dL — AB (ref 0.61–1.24)
Calcium: 8 mg/dL — ABNORMAL LOW (ref 8.9–10.3)
Chloride: 110 mmol/L (ref 98–111)
GFR calc Af Amer: 12 mL/min — ABNORMAL LOW (ref >60)
GFR calc non Af Amer: 11 mL/min — ABNORMAL LOW (ref >60)
GLUCOSE: 87 mg/dL (ref 70–99)
POTASSIUM: 6.5 mmol/L — AB (ref 3.5–5.1)
Sodium: 149 mmol/L — ABNORMAL HIGH (ref 135–145)

## 2018-04-06 LAB — ABO/RH: ABO/RH(D): A POS

## 2018-04-06 LAB — PROTIME-INR
INR: 1.72
Prothrombin Time: 19.9 seconds — ABNORMAL HIGH (ref 11.4–15.2)

## 2018-04-06 LAB — POC OCCULT BLOOD, ED: Fecal Occult Bld: POSITIVE — AB

## 2018-04-06 LAB — TYPE AND SCREEN
ABO/RH(D): A POS
ANTIBODY SCREEN: NEGATIVE

## 2018-04-06 MED ORDER — LORAZEPAM 2 MG/ML IJ SOLN: 2.0000 mg | INTRAMUSCULAR | Status: DC | PRN

## 2018-04-06 MED ORDER — GLYCOPYRROLATE 0.2 MG/ML IJ SOLN: 0.2000 mg | INTRAMUSCULAR | Status: DC | PRN

## 2018-04-06 MED ORDER — MORPHINE SULFATE (PF) 2 MG/ML IV SOLN: 2.0000 mg | INTRAVENOUS | Status: DC | PRN

## 2018-04-06 MED ORDER — MORPHINE 100MG IN NS 100ML (1MG/ML) PREMIX INFUSION
0.0000 mg/h | INTRAVENOUS | Status: DC
  Administered 2018-04-06: 5 mg/h via INTRAVENOUS

## 2018-04-06 MED ORDER — GLYCOPYRROLATE 1 MG PO TABS: 1.0000 mg | ORAL_TABLET | ORAL | Status: DC | PRN

## 2018-04-06 MED ORDER — ACETAMINOPHEN 650 MG RE SUPP: 650.0000 mg | Freq: Four times a day (QID) | RECTAL | Status: DC | PRN

## 2018-04-06 MED ORDER — SODIUM CHLORIDE 0.9 % IV BOLUS (SEPSIS)
1000.0000 mL | Freq: Once | INTRAVENOUS | Status: AC
  Administered 2018-04-06: 1000 mL via INTRAVENOUS

## 2018-04-06 MED ORDER — DIPHENHYDRAMINE HCL 50 MG/ML IJ SOLN: 25.0000 mg | INTRAMUSCULAR | Status: DC | PRN

## 2018-04-06 MED ORDER — ACETAMINOPHEN 325 MG PO TABS: 650.0000 mg | ORAL_TABLET | Freq: Four times a day (QID) | ORAL | Status: DC | PRN

## 2018-04-06 MED ORDER — POLYVINYL ALCOHOL 1.4 % OP SOLN: 1.0000 [drp] | Freq: Four times a day (QID) | OPHTHALMIC | Status: DC | PRN

## 2018-04-06 MED ORDER — HALOPERIDOL LACTATE 5 MG/ML IJ SOLN: 2.5000 mg | INTRAMUSCULAR | Status: DC | PRN

## 2018-04-06 MED ORDER — MORPHINE BOLUS VIA INFUSION
5.0000 mg | INTRAVENOUS | Status: DC | PRN
  Filled 2018-04-06: qty 5

## 2018-04-18 ENCOUNTER — Encounter (INDEPENDENT_AMBULATORY_CARE_PROVIDER_SITE_OTHER): Payer: Medicare HMO | Admitting: Ophthalmology

## 2018-05-02 NOTE — ED Notes (Signed)
Time of death 84.

## 2018-05-02 NOTE — Progress Notes (Signed)
Responded to referral from on call chaplain to continue support to patient and family.  Pt. Passed. Facilitated information sharing between family and staff.  Jaclynn Major, [place], [facility], Pager [phone]

## 2018-05-02 NOTE — ED Triage Notes (Addendum)
Pt BIB [facility] for post cardiac arrest. EMS reports the family called 9-1-1 for a fall @ 0400 hrs. 9-1-1 sent out a BLS truck for a lifting assistance. Once they arrived on scene, BLS called back requesting ALS for a cardiac arrest call, CPR immediately began. ALS arrived on scene and began ALS interventions to include an IO in the right proximal tibia, 7.0 ETT, and epi administered x2. EMS reports pulses were regained @ 0432 and they initiated transport. While in route, EMS advised they lost pulses again, started CPR, administered another EPI, and then paced the patient for about 3-4 minutes. EMS advised shortly after that the patient regained pulses @ 0454 again however the pt lost pulses again @ 0500 and CPR was initiated and continued. Patient was on the [place] upon arrival here at [facility].

## 2018-05-02 NOTE — ED Provider Notes (Signed)
[place] EMERGENCY DEPARTMENT Provider Note   CSN: [id number] Arrival date & time: 04/15/2018  0240     History   Chief Complaint Chief Complaint  Patient presents with  . Post Cardiac Arrest  Level 5 caveat due to acuity of condition  HPI Mario Hartman is a 85 y.o. male.  The history is provided by the EMS personnel and a relative.  Cardiac Arrest  Airway:  Intubation prior to arrival Risk factors: COPD   Patient with history of emphysema presents with cardiac arrest.  EMS was called for a fall, as patient appeared to fall from bed next to his bedside toilet.  He was found pulseless and apneic.  CPR was started for 20 minutes and then regained pulses.  Patient was intubated in the field.  He received multiple doses of epinephrine.  He also required pacing at times and did lose his pulses just prior to arrival.  No other details known on arrival  Past Medical History:  Diagnosis Date  . Emphysema of lung ([place])   . Peripheral vascular disease [facility])     Patient Active Problem List   Diagnosis Date Noted  . Acute hypoxemic respiratory failure ([place]) 12/20/2017  . Leukocytosis 12/20/2017  . Dyslipidemia 12/20/2017  . Essential hypertension 12/20/2017  . Diabetes ([place]) 12/20/2017  . BPH (benign prostatic hyperplasia) 12/20/2017  . COPD exacerbation ([place]) 12/19/2017  . Chronic skin ulcer ([place]) 06/12/2015    Past Surgical History:  Procedure Laterality Date  . CAROTID ENDARTERECTOMY    . VASCULAR SURGERY          Home Medications    Prior to Admission medications   Medication Sig Start Date End Date Taking? Authorizing Provider  albuterol (PROVENTIL HFA;VENTOLIN HFA) 108 (90 Base) MCG/ACT inhaler Inhale 2 puffs into the lungs every 6 (six) hours as needed for wheezing or shortness of breath. 12/21/17   Kathie Dike, MD  albuterol (PROVENTIL) (2.5 MG/3ML) 0.083% nebulizer solution Take 3 mLs (2.5 mg total) by nebulization every 6 (six) hours as  needed for wheezing or shortness of breath. 12/21/17   Kathie Dike, MD  atorvastatin (LIPITOR) 20 MG tablet Take 10 mg by mouth daily.     [provider]  BESIVANCE 0.6 % SUSP Place 1 drop into the right eye See admin instructions. Instill one drop into right eye 4 times daily for 2 days following each monthly eye injection as directed. 09/21/17   [provider]  cholecalciferol (VITAMIN D) 1000 units tablet Take 1,000 Units by mouth daily.    [provider]  cyclobenzaprine (FLEXERIL) 10 MG tablet Take 10 mg by mouth at bedtime.    [provider]  doxycycline (VIBRA-TABS) 100 MG tablet Take 1 tablet (100 mg total) by mouth every 12 (twelve) hours. 12/21/17   Kathie Dike, MD  glipiZIDE (GLUCOTROL XL) 2.5 MG 24 hr tablet Take 1.25 mg by mouth daily with breakfast.     [provider]  guaiFENesin (MUCINEX) 600 MG 12 hr tablet Take 1 tablet (600 mg total) by mouth 2 (two) times daily. 12/21/17 12/21/18  Kathie Dike, MD  predniSONE (DELTASONE) 10 MG tablet Take 40mg  po daily for 2 days then 30mg  daily for 2 days then 20mg  daily for 2 days then 10mg  daily for 2 days then stop 12/21/17   Kathie Dike, MD  terazosin (HYTRIN) 10 MG capsule Take 10 mg by mouth at bedtime.  10/11/17   [provider]  tiotropium (SPIRIVA HANDIHALER) 18  MCG inhalation capsule Place 1 capsule (18 mcg total) into inhaler and inhale daily. 12/21/17 12/21/18  Kathie Dike, MD  traMADol (ULTRAM) 50 MG tablet Take by mouth every 6 (six) hours as needed.    [provider]    Family History Family History  Problem Relation Age of Onset  . Breast cancer Mother   . Alcoholism Father     Social History Social History   Tobacco Use  . Smoking status: Former Smoker    Last attempt to quit: 08/27/1995    Years since quitting: 22.6  . Smokeless tobacco: Never Used  Substance Use Topics  . Alcohol use: Yes    Alcohol/week: 8.0 standard drinks    Types:  8 Shots of liquor per week  . Drug use: Never     Allergies   Patient has no known allergies.   Review of Systems Review of Systems  Unable to perform ROS: Acuity of condition     Physical Exam Updated Vital Signs Ht 1.651 m (5\' 5" )   Wt 56.7 kg   SpO2 100%   BMI 20.80 kg/m   Physical Exam  CONSTITUTIONAL: Elderly, frail, cachectic HEAD: Normocephalic/atraumatic EYES: EOMI/PERRL ENMT: Mucous membranes moist, ET tube in place NECK: supple no meningeal signs SPINE/BACK: Skin breakdown noted to back CV: S1/S2 noted, no murmurs/rubs/gallops noted LUNGS: Coarse breath sounds bilaterally ABDOMEN: soft, nontender GU: Normal appearance Rectal-stool is dark, no blood NEURO: Pt is unresponsive EXTREMITIES: pulses normal/equal, IO right tibia SKIN: warm, color normal PSYCH: unable to assess  ED Treatments / Results  Labs (all labs ordered are listed, but only abnormal results are displayed) Labs Reviewed  POC OCCULT BLOOD, ED - Abnormal; Notable for the following components:      Result Value   Fecal Occult Bld POSITIVE (*)    All other components within normal limits  BASIC METABOLIC PANEL  CBC WITH DIFFERENTIAL/PLATELET  PROTIME-INR  TYPE AND SCREEN    EKG EKG Interpretation  Date/Time:  04/20/2018 05:08:40 EST Ventricular Rate:  125 PR Interval:    QRS Duration: 130 QT Interval:  334 QTC Calculation: 482 R Axis:   -84 Text Interpretation:  tachycardic rhythm indeterminate Right bundle branch block Inferior infarct, old Lateral leads are also involved Interpretation limited secondary to artifact Confirmed by Ripley Fraise ([id number]) on Apr 20, 2018 5:24:39 AM   Radiology Dg Chest Port 1 View  Result Date: 2018/04/20 CLINICAL DATA:  Initial evaluation status post CPR, shortness of breath. EXAM: PORTABLE CHEST 1 VIEW COMPARISON:  Prior radiograph from 12/19/2017. FINDINGS: Endotracheal tube in place with tip positioned approximately 6.4 cm above  the carina. Enteric to coiled within the stomach. Defibrillator pad overlies the chest. Cardiac and mediastinal silhouettes are stable, and remain within normal limits. Lungs are hyperinflated. There is a new right-sided pneumothorax seen at the right lung apex as well as the right base. Estimated volume approximately 20-30%. No significant mediastinal shift at this time. Increased density within the mid and lower right lung as well as the left lung base since previous, which could reflect atelectasis, congestion, or possibly aspiration. No appreciable pleural effusion. No acute osseous abnormality. IMPRESSION: 1. New right-sided pneumothorax at the right lung apex as well as the right base. Estimated volume approximately 20-30%. 2. Increased density within the mid and lower right lung as well as the left lung base, which could reflect atelectasis, congestion, or possibly aspiration. 3. Endotracheal tube in place with tip positioned 6.4 cm above the carina. Critical Value/emergent  results were called by telephone at the time of interpretation on 2018-04-23 at 5:38 am to Dr. Ripley Fraise , who verbally acknowledged these results. Electronically Signed   By: Jeannine Boga M.D.   On: 23-Apr-2018 05:41    Procedures Glidescope laryngoscopy Date/Time: 2018-04-23 5:50 AM Performed by: Ripley Fraise, MD Authorized by: Ripley Fraise, MD  Consent: The procedure was performed in an emergent situation. Required items: required blood products, implants, devices, and special equipment available Local anesthesia used: no  Anesthesia: Local anesthesia used: no  Sedation: Patient sedated: no  Patient tolerance: Patient tolerated the procedure well with no immediate complications Comments: Patient was intubated by paramedics prior to arrival.  I confirmed ET tube placement through vocal cords via glide scope.  Food products noted around vocal cords which was suctioned successfully     CRITICAL  CARE Performed by: Sharyon Cable Total critical care time: 45 minutes Critical care time was exclusive of separately billable procedures and treating other patients. Critical care was necessary to treat or prevent imminent or life-threatening deterioration. Critical care was time spent personally by me on the following activities: development of treatment plan with patient and/or surrogate as well as nursing, discussions with consultants, evaluation of patient's response to treatment, examination of patient, obtaining history from patient or surrogate, ordering and performing treatments and interventions, ordering and review of laboratory studies, ordering and review of radiographic studies, pulse oximetry and re-evaluation of patient's condition.    CPR Procedure Note I PERSONALLY DIRECTED ANCILLARY STAFF OR/PERFORMED CPR IN AN EFFORT TO REGAIN RETURN OF SPONTANEOUS CIRCULATION IN AN EFFORT TO MAINTAIN NEURO, CARDIAC AND SYSTEMIC PERFUSION   Medications Ordered in ED Medications  acetaminophen (TYLENOL) tablet 650 mg (has no administration in time range)    Or  acetaminophen (TYLENOL) suppository 650 mg (has no administration in time range)  diphenhydrAMINE (BENADRYL) injection 25 mg (has no administration in time range)  glycopyrrolate (ROBINUL) tablet 1 mg (has no administration in time range)    Or  glycopyrrolate (ROBINUL) injection 0.2 mg (has no administration in time range)    Or  glycopyrrolate (ROBINUL) injection 0.2 mg (has no administration in time range)  polyvinyl alcohol (LIQUIFILM TEARS) 1.4 % ophthalmic solution 1 drop (has no administration in time range)  morphine 2 MG/ML injection 2 mg (has no administration in time range)  morphine bolus via infusion 5 mg (has no administration in time range)  morphine 100mg  in NS 160mL (1mg /mL) infusion - premix (has no administration in time range)  LORazepam (ATIVAN) injection 2-4 mg (has no administration in time range)    haloperidol lactate (HALDOL) injection 2.5-5 mg (has no administration in time range)  sodium chloride 0.9 % bolus 1,000 mL (0 mLs Intravenous Stopped 2018-04-23 0614)  sodium chloride 0.9 % bolus 1,000 mL (0 mLs Intravenous Stopped 04/23/18 0614)     Initial Impression / Assessment and Plan / ED Course  I have reviewed the triage vital signs and the nursing notes.  Pertinent labs & imaging results that were available during my care of the patient were reviewed by me and considered in my medical decision making (see chart for details).     6:08 AM Patient seen on arrival post CPR.  CPR was continued on arrival.  First pulse check revealed bounding pulses.  I confirmed ET tube placement. Patient is been given IV fluids.  Chest x-ray reveals 20% pneumothorax Discussion started with grandsons and daughter, who is POA Spoken to critical care about this patient  6:54 AM After discussion with family, and daughter Bethena Roys who is POA, they declined further intervention request comfort care. They do not wish to have further testing and not to treat PTX Seen by critical care Dr. Patsey Berthold who has ordered palliative orders Pt will likely be extubated in the ED Final Clinical Impressions(s) / ED Diagnoses   Final diagnoses:  Pneumothorax on right  Cardiac arrest [facility])    ED Discharge Orders    None       Ripley Fraise, MD 2018/04/13 [phone]

## 2018-05-02 NOTE — ED Notes (Signed)
Pt extubated.  Grandsons at bedside.

## 2018-05-02 NOTE — ED Notes (Signed)
Pt cleaned and given peri care.

## 2018-05-02 NOTE — ED Notes (Signed)
Morphine drip wasted with RN Buddy Duty in med room steri cycle.

## 2018-05-02 NOTE — Progress Notes (Signed)
Terminal extubation done per MD order.

## 2018-05-02 NOTE — ED Notes (Signed)
Family at bedside with admitting MD

## 2018-05-02 NOTE — Progress Notes (Signed)
I responded to a page from the nurse to provide spiritual support for the patient's family. I visited with the family while the physician shared an update of the patient's status. I provided spiritual support through words of encouragement and pastoral presence. I shared that the Chaplain is available for additional support as needed or requested.    2018-05-06 0515  Clinical Encounter Type  Visited With Family;Patient not available  Visit Type Spiritual support;ED;Trauma  Referral From Nurse  Consult/Referral To Chaplain  Stress Factors  Family Stress Factors Exhausted  Advance Directives (For Healthcare)  Does Patient Have a Medical Advance Directive? No  Would patient like information on creating a medical advance directive? No - Patient declined  [place]  Does Patient Have a Mental Health Advance Directive? No  Would patient like information on creating a mental health advance directive? No - Patient declined    Chaplain Dr Redgie Grayer

## 2018-05-02 NOTE — Consult Note (Addendum)
NAME:  Mario Hartman, MRN:  [id number], DOB:  [birth year], LOS: 0 ADMISSION DATE:  04/10/2018, CONSULTATION DATE: 2018-04-10 REFERRING MD: Dr. Christy Gentles , CHIEF COMPLAINT: cardiac arrest   Brief History   Mario Hartman is a 85 year old man with a history of COPD (recent admission 8/19 for COPD exacerbation), DM2, HLD, HTN, here following a prolonged and recurrent cardiac arrest in the field.    History of present illness   Found on ground by his grandson, was awake at the time but unable to get up from ground.  Developed cardiac arrest and required multiple rounds of CPR.  Intubated in the field.  Developed cardiac arrest again in transport x 2, still in cardiac arrest on [employer] chest compressor on arrival to [facility].  R PTX noted on CXR.    Currently completely non-responsive to external stimuli, on vent, breathing at rate of 29 above a set rate of 16.    Just moved in with his daughter for additional assistance last week.  Grandsons at bedside, plan to transition to comfort care after discussion with ED Dr. Christy Gentles.  ED confirmed with his daughter who is unable to come in.    Past Medical History  DM2,  COPD discharged home on 2L O2 in 11/2017 HLD HTN   Significant Hospital Events     Consults:  Palliative   Procedures:  Intubated by EMS  Significant Diagnostic Tests:    Micro Data:    Antimicrobials:    Interim history/subjective:    Objective   Blood pressure (!) 103/44, pulse 85, temperature (!) 94.4 F (34.7 C), temperature source Rectal, resp. rate 13, height _0  (1.651 m), weight 56.7 kg, SpO2 100 %.    Vent Mode: PRVC FiO2 (%):  [100 %] 100 % Set Rate:  [16 bmp] 16 bmp Vt Set:  [370 mL-510 mL] 370 mL PEEP:  [5 cmH20] 5 cmH20 Plateau Pressure:  [16 cmH20] 16 cmH20   Intake/Output Summary (Last 24 hours) at 10-Apr-2018 0644 Last data filed at Apr 10, 2018 3295 Gross per 24 hour  Intake 4500 ml  Output 0 ml  Net 4500 ml   Filed Weights   04-10-2018 0514    Weight: 56.7 kg    Examination: General: Intubated, NAD HENT: dry mucous membranes Lungs: Non labored appearing breaths Cardiovascular: RRR  Abdomen: non distended Extremities: bruising on hands Neuro: non-responsive to any external stimuli including pain  Resolved Hospital Problem list     Assessment & Plan:  S/p Cardiac arrest, post arrest acute encephalopathy:  Appears to have had a prolonged cardiac arrest, now non responsive.   ED doc has met with family and developed plan to transition goals to comfort care, which I agree is completely appropriate.   I have placed comfort care order set with plan to initiate morphine infusion following boluses for goal RR <18.  Can titrate to pain or respiratory distress.  End of life planning: I discussed plan with grandsons, they would prefer to make him comfortable as soon as possible, and are fine doing this extubation in the ED as opposed to waiting for possibly prolonged period in the room.  I anticipate he would pass away within minutes to hours once extubated.  ED will continue to manage and admit to hospitalist as comfort care patient if his death does not occur imminently as expected, and he is stable for transfer on admission.   Thank you for consult.      Labs   CBC: Recent  Labs  Lab 21-Apr-2018 0542  WBC 15.1*  NEUTROABS PENDING  HGB 13.3  HCT 45.8  MCV 104.8*  PLT 115    Basic Metabolic Panel: Recent Labs  Lab 2018/04/21 0542  NA 149*  K 6.5*  CL 110  CO2 12*  GLUCOSE 87  BUN 112*  CREATININE 4.45*  CALCIUM 8.0*   GFR: Estimated Creatinine Clearance: 8.5 mL/min (A) (by C-G formula based on SCr of 4.45 mg/dL (H)). Recent Labs  Lab 21-Apr-2018 0542  WBC 15.1*    Liver Function Tests: No results for input(s): AST, ALT, ALKPHOS, BILITOT, PROT, ALBUMIN in the last 168 hours. No results for input(s): LIPASE, AMYLASE in the last 168 hours. No results for input(s): AMMONIA in the last 168 hours.  ABG     Component Value Date/Time   PHART 7.169 (LL) 04/21/18 0635   PCO2ART 36.1 04/21/18 0635   PO2ART 50.0 (L) 04-21-2018 0635   HCO3 13.6 (L) 2018/04/21 0635   TCO2 15 (L) 2018/04/21 0635   ACIDBASEDEF 14.0 (H) 21-Apr-2018 0635   O2SAT 82.0 04/21/18 0635     Coagulation Profile: Recent Labs  Lab 04/21/2018 0542  INR 1.72    Cardiac Enzymes: No results for input(s): CKTOTAL, CKMB, CKMBINDEX, TROPONINI in the last 168 hours.  HbA1C: Hgb A1c MFr Bld  Date/Time Value Ref Range Status  10/24/2007 05:45 AM   Final   5.6 (NOTE)   The ADA recommends the following therapeutic goal for glycemic   control related to Hgb A1C measurement:   Goal of Therapy:   < 7.0% Hgb A1C   Reference: American Diabetes Association: Clinical Practice   Recommendations 2008, Diabetes Care,  2008, 31:(Suppl 1).    CBG: No results for input(s): GLUCAP in the last 168 hours.  Review of Systems:   Unable to assess due to mental status  Past Medical History  He,  has a past medical history of Emphysema of lung ([place]) and Peripheral vascular disease ([place]).   Surgical History    Past Surgical History:  Procedure Laterality Date  . CAROTID ENDARTERECTOMY    . VASCULAR SURGERY       Social History   reports that he quit smoking about 22 years ago. He has never used smokeless tobacco. He reports that he drinks about 8.0 standard drinks of alcohol per week. He reports that he does not use drugs.   Family History   His family history includes Alcoholism in his father; Breast cancer in his mother.   Allergies No Known Allergies   Home Medications  Prior to Admission medications   Medication Sig Start Date End Date Taking? Authorizing Provider  albuterol (PROVENTIL HFA;VENTOLIN HFA) 108 (90 Base) MCG/ACT inhaler Inhale 2 puffs into the lungs every 6 (six) hours as needed for wheezing or shortness of breath. 12/21/17   Kathie Dike, MD  albuterol (PROVENTIL) (2.5 MG/3ML) 0.083% nebulizer solution  Take 3 mLs (2.5 mg total) by nebulization every 6 (six) hours as needed for wheezing or shortness of breath. 12/21/17   Kathie Dike, MD  atorvastatin (LIPITOR) 20 MG tablet Take 10 mg by mouth daily.     [provider]  BESIVANCE 0.6 % SUSP Place 1 drop into the right eye See admin instructions. Instill one drop into right eye 4 times daily for 2 days following each monthly eye injection as directed. 09/21/17   [provider]  cholecalciferol (VITAMIN D) 1000 units tablet Take 1,000 Units by mouth daily.    [provider]  cyclobenzaprine (FLEXERIL) 10 MG tablet Take 10 mg by mouth at bedtime.    [provider]  doxycycline (VIBRA-TABS) 100 MG tablet Take 1 tablet (100 mg total) by mouth every 12 (twelve) hours. 12/21/17   Kathie Dike, MD  glipiZIDE (GLUCOTROL XL) 2.5 MG 24 hr tablet Take 1.25 mg by mouth daily with breakfast.     [provider]  guaiFENesin (MUCINEX) 600 MG 12 hr tablet Take 1 tablet (600 mg total) by mouth 2 (two) times daily. 12/21/17 12/21/18  Kathie Dike, MD  predniSONE (DELTASONE) 10 MG tablet Take 42m po daily for 2 days then 331mdaily for 2 days then 2019maily for 2 days then 62m49mily for 2 days then stop 12/21/17   MemoKathie Dike  terazosin (HYTRIN) 10 MG capsule Take 10 mg by mouth at bedtime.  10/11/17   [provider]  tiotropium (SPIRIVA HANDIHALER) 18 MCG inhalation capsule Place 1 capsule (18 mcg total) into inhaler and inhale daily. 12/21/17 12/21/18  MemoKathie Dike  traMADol (ULTRAM) 50 MG tablet Take by mouth every 6 (six) hours as needed.    [provider]     Critical care time: 45 minutes, including meeting with family and coordinating care.

## 2018-05-02 NOTE — ED Notes (Signed)
Called pharmacy to verify morphine.  Await med from pharmacy.

## 2018-05-02 NOTE — ED Provider Notes (Signed)
  Physical Exam  BP (!) 105/42   Pulse 99   Temp (!) 94.4 F (34.7 C) (Rectal)   Resp 17   Ht 5\' 5"  (1.651 m)   Wt 56.7 kg   SpO2 (!) 89%   BMI 20.80 kg/m   Physical Exam  ED Course/Procedures     Procedures  MDM  Patient expired at 81 with family present.       Davonna Belling, MD 05/01/2018 [phone]

## 2018-05-02 DEATH — deceased

## 2020-05-16 IMAGING — DX DG CHEST 2V
2 series · 2 of 2 positions shown · non-contrast
Comparison: 10/25/2007

CLINICAL DATA: Dyspnea for months

EXAM:
CHEST - 2 VIEW

[chest lat]
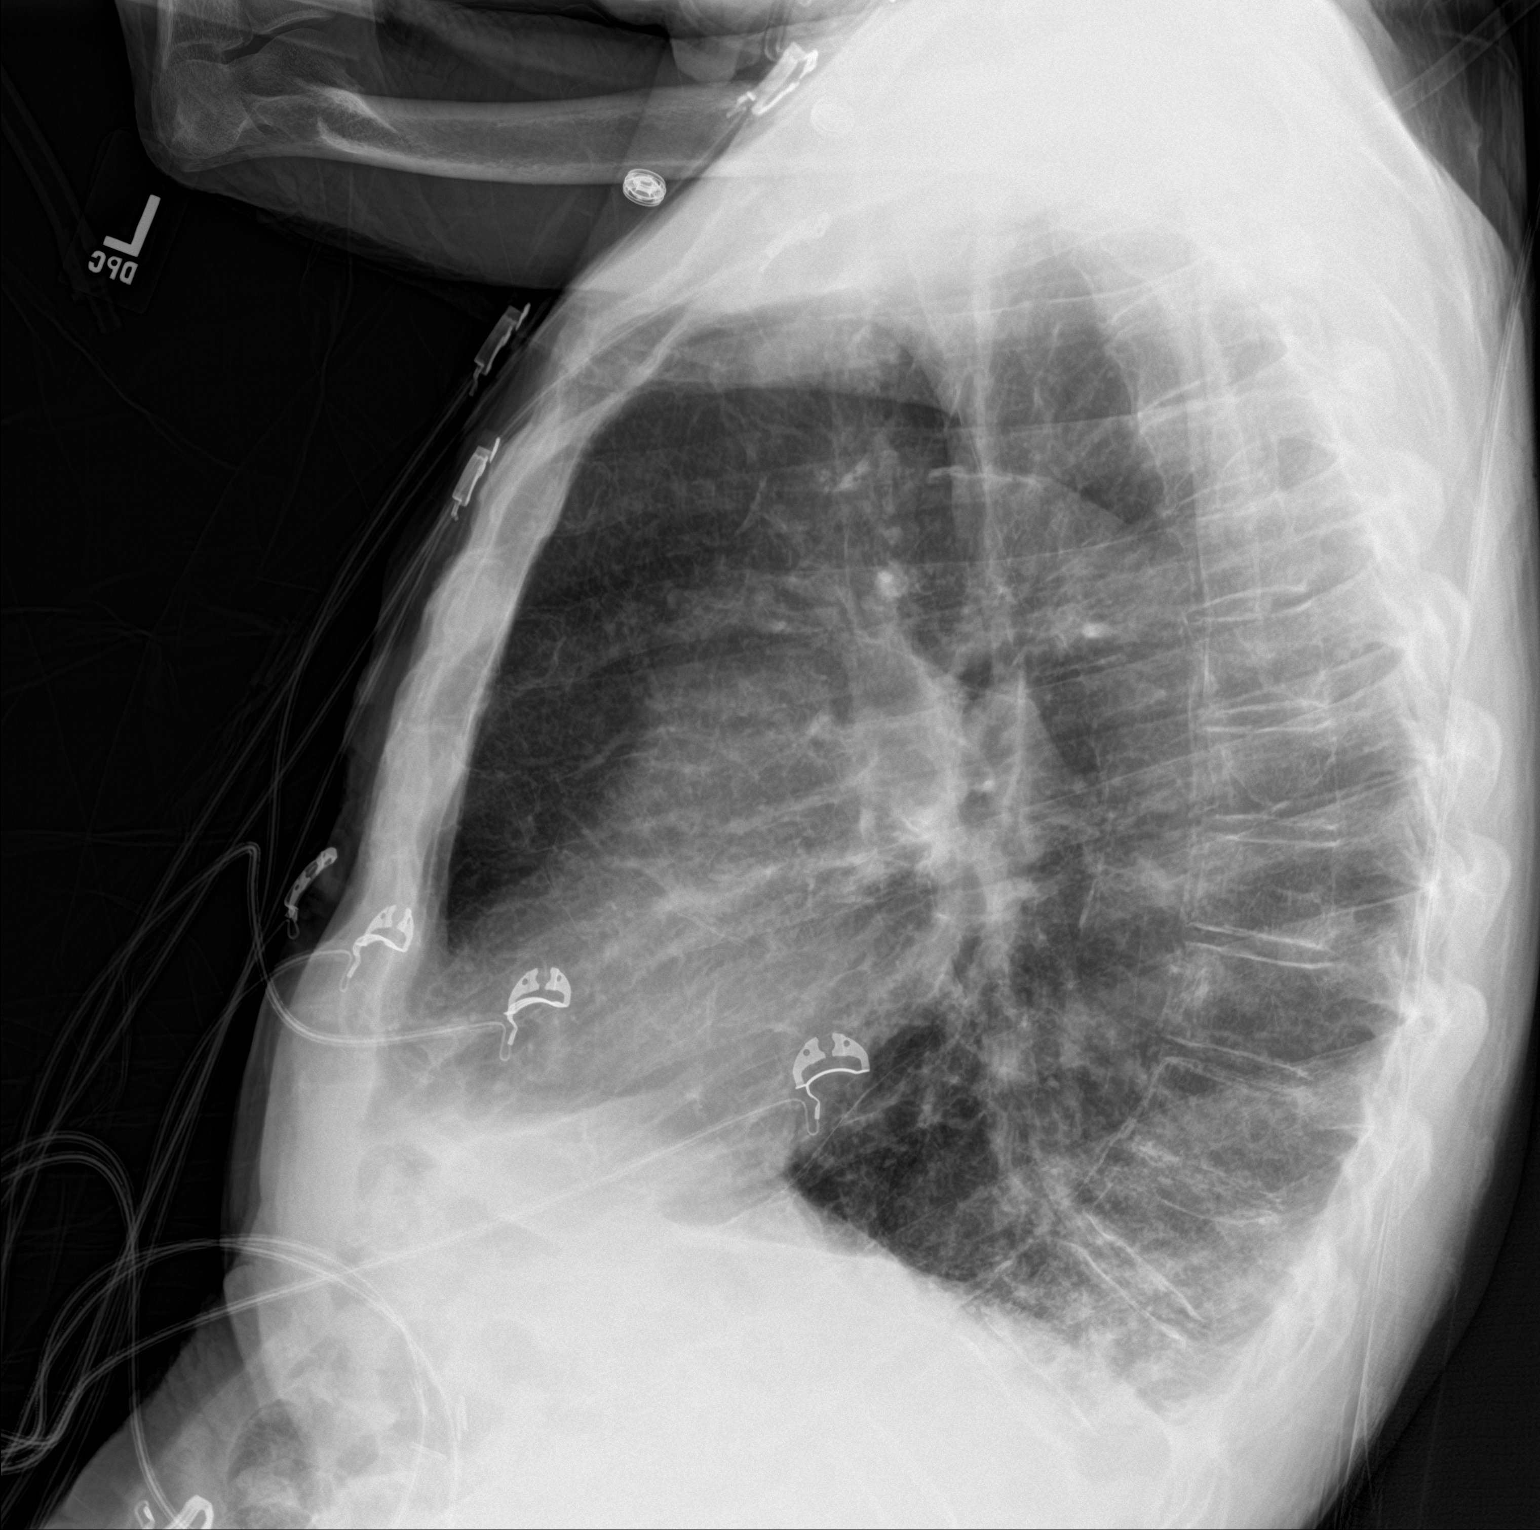

[chest ap]
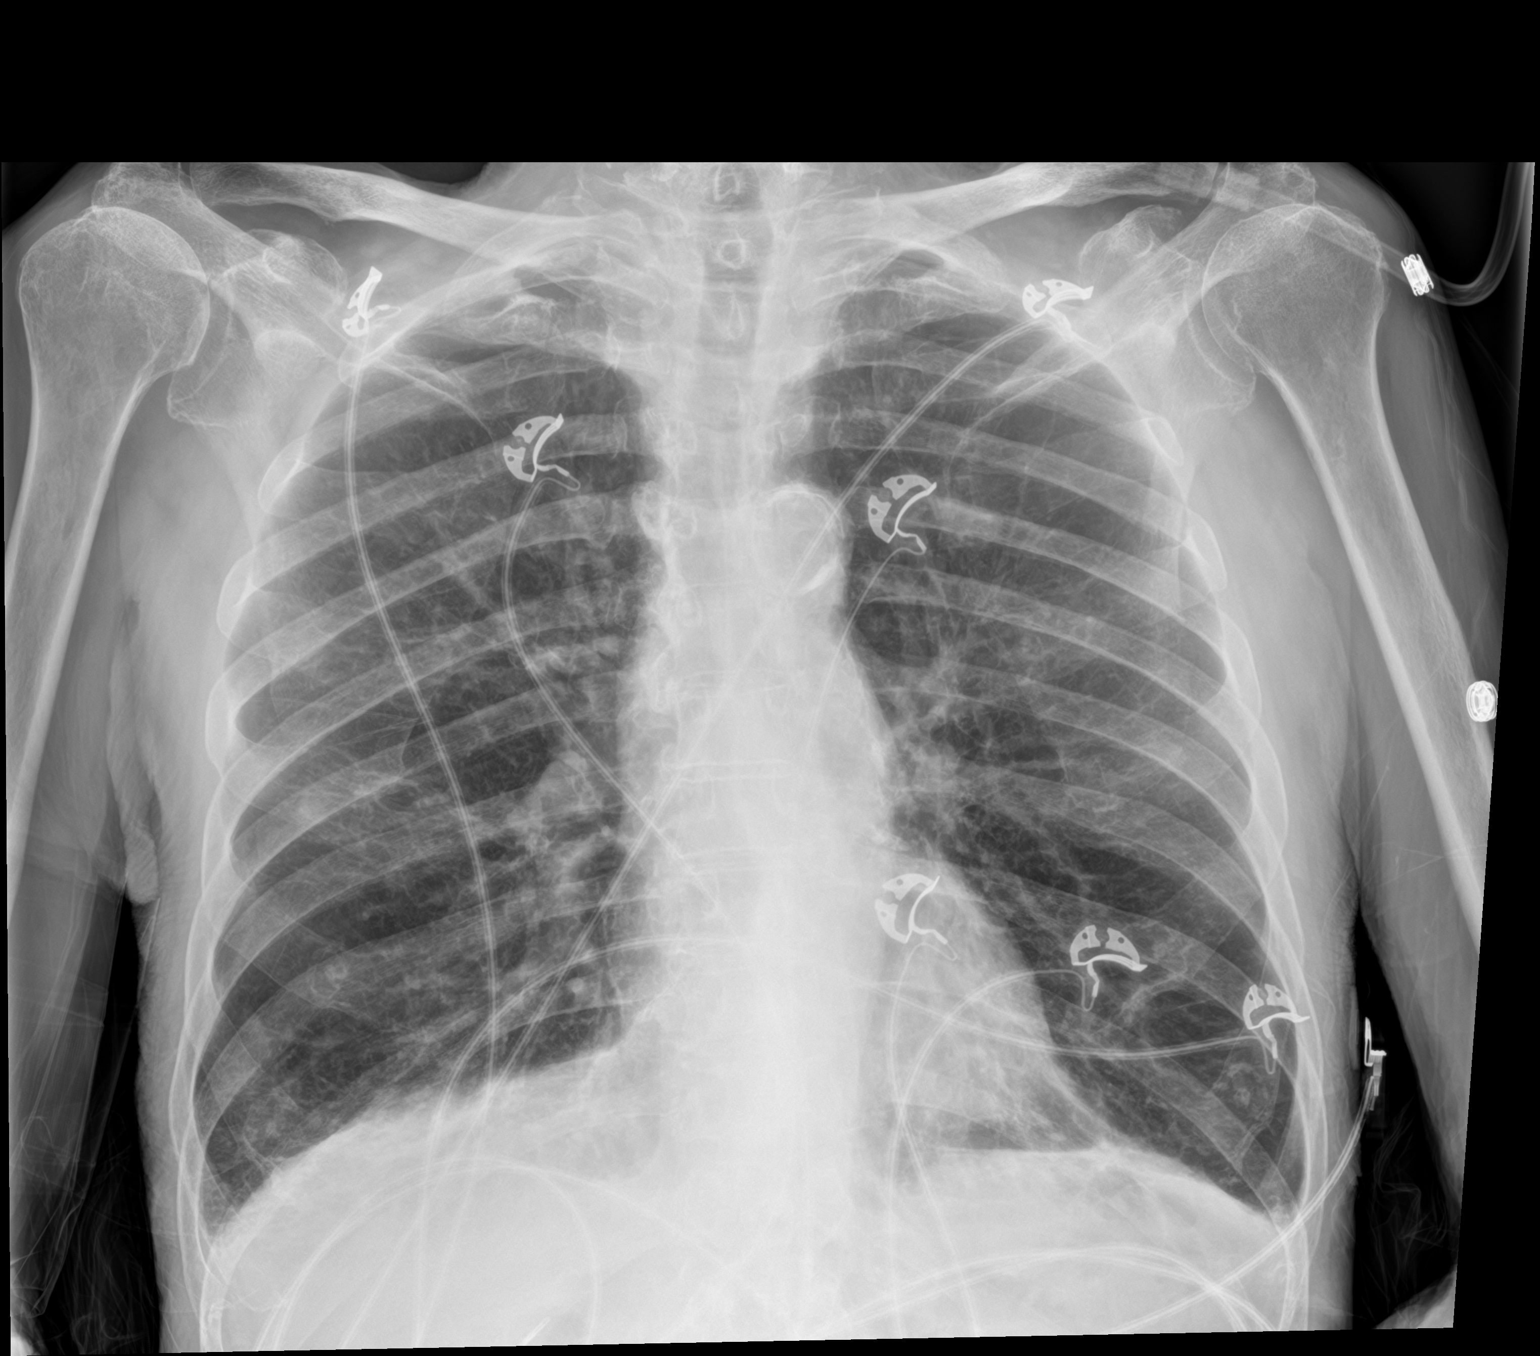

[2 of 2 positions shown; findings below may reference images not displayed]

FINDINGS: Emphysematous hyperinflation of the lungs with subpleural areas of
fibrosis at the right lung base. No pulmonary consolidation or overt
pulmonary edema. Heart size is normal. There is moderate
atherosclerosis at the arch with mild uncoiling of the thoracic
aorta. Vascular clips project over the base of the neck on the
right. Degenerative changes are present along the included shoulders
dorsal spine. Blunting the posterior costophrenic angles may be
secondary to hyperinflation. Trace effusions are not entirely
excluded. Lower thoracic compression deformity is new since 0664
with approximately 50% anterior height loss. Correlate for pain at
this level. This is more likely chronic.
IMPRESSION: 1. Emphysematous hyperinflation of the lungs. Mild chronic
interstitial change at the lung bases and atelectasis.
2. Moderate aortic atherosclerosis.
3. Likely remote lower thoracic 50% anterior compression deformity.
Correlate for pain at this level however as this is a new finding
since [DATE].
